# Patient Record
Sex: Female | Born: 2000 | Race: Black or African American | Hispanic: No | Marital: Single | Smoking: Never smoker
Health system: Southern US, Community
[De-identification: ages and names within clinical notes are randomized; demographics above are authoritative.]

## PROBLEM LIST (undated history)

## (undated) DIAGNOSIS — J45909 Unspecified asthma, uncomplicated: Secondary | ICD-10-CM

## (undated) DIAGNOSIS — Z889 Allergy status to unspecified drugs, medicaments and biological substances status: Secondary | ICD-10-CM

## (undated) DIAGNOSIS — A749 Chlamydial infection, unspecified: Secondary | ICD-10-CM

## (undated) HISTORY — PX: HERNIA REPAIR: SHX51

## (undated) HISTORY — DX: Allergy status to unspecified drugs, medicaments and biological substances: Z88.9

## (undated) HISTORY — DX: Unspecified asthma, uncomplicated: J45.909

---
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
## 2018-01-23 ENCOUNTER — Emergency Department (HOSPITAL_BASED_OUTPATIENT_CLINIC_OR_DEPARTMENT_OTHER): Payer: Medicaid Other

## 2018-01-23 ENCOUNTER — Encounter (HOSPITAL_BASED_OUTPATIENT_CLINIC_OR_DEPARTMENT_OTHER): Payer: Self-pay | Admitting: *Deleted

## 2018-01-23 ENCOUNTER — Emergency Department (HOSPITAL_BASED_OUTPATIENT_CLINIC_OR_DEPARTMENT_OTHER)
Admission: EM | Admit: 2018-01-23 | Discharge: 2018-01-23 | Disposition: A | Discharge status: Home / Self Care | Payer: Medicaid Other | Attending: Emergency Medicine | Admitting: Emergency Medicine

## 2018-01-23 DIAGNOSIS — N10 Acute tubulo-interstitial nephritis: Secondary | ICD-10-CM | POA: Diagnosis not present

## 2018-01-23 DIAGNOSIS — R509 Fever, unspecified: Secondary | ICD-10-CM | POA: Diagnosis present

## 2018-01-23 DIAGNOSIS — N12 Tubulo-interstitial nephritis, not specified as acute or chronic: Secondary | ICD-10-CM

## 2018-01-23 DIAGNOSIS — J45909 Unspecified asthma, uncomplicated: Secondary | ICD-10-CM | POA: Diagnosis not present

## 2018-01-23 DIAGNOSIS — E876 Hypokalemia: Secondary | ICD-10-CM | POA: Insufficient documentation

## 2018-01-23 LAB — CBC WITH DIFFERENTIAL/PLATELET
Basophils Absolute: 0 10*3/uL (ref 0.0–0.1)
Basophils Relative: 0 %
EOS ABS: 0 10*3/uL (ref 0.0–0.7)
EOS PCT: 0 %
HCT: 35.1 % — ABNORMAL LOW (ref 36.0–46.0)
Hemoglobin: 11.7 g/dL — ABNORMAL LOW (ref 12.0–15.0)
LYMPHS ABS: 0.8 10*3/uL (ref 0.7–4.0)
LYMPHS PCT: 7 %
MCH: 26.5 pg (ref 26.0–34.0)
MCHC: 33.3 g/dL (ref 30.0–36.0)
MCV: 79.6 fL (ref 78.0–100.0)
MONO ABS: 0.9 10*3/uL (ref 0.1–1.0)
Monocytes Relative: 8 %
Neutro Abs: 10.1 10*3/uL — ABNORMAL HIGH (ref 1.7–7.7)
Neutrophils Relative %: 85 %
PLATELETS: 207 10*3/uL (ref 150–400)
RBC: 4.41 MIL/uL (ref 3.87–5.11)
RDW: 14.8 % (ref 11.5–15.5)
WBC: 11.9 10*3/uL — AB (ref 4.0–10.5)

## 2018-01-23 LAB — COMPREHENSIVE METABOLIC PANEL
ALBUMIN: 4.3 g/dL (ref 3.5–5.0)
ALT: 13 U/L — ABNORMAL LOW (ref 14–54)
AST: 23 U/L (ref 15–41)
Alkaline Phosphatase: 45 U/L (ref 38–126)
Anion gap: 13 (ref 5–15)
BILIRUBIN TOTAL: 1.3 mg/dL — AB (ref 0.3–1.2)
BUN: 10 mg/dL (ref 6–20)
CHLORIDE: 100 mmol/L — AB (ref 101–111)
CO2: 21 mmol/L — ABNORMAL LOW (ref 22–32)
Calcium: 8.9 mg/dL (ref 8.9–10.3)
Creatinine, Ser: 1 mg/dL (ref 0.44–1.00)
GFR calc Af Amer: >60 mL/min (ref >60)
GLUCOSE: 106 mg/dL — AB (ref 65–99)
Potassium: 2.9 mmol/L — ABNORMAL LOW (ref 3.5–5.1)
Sodium: 134 mmol/L — ABNORMAL LOW (ref 135–145)
Total Protein: 8.5 g/dL — ABNORMAL HIGH (ref 6.5–8.1)

## 2018-01-23 LAB — URINALYSIS, MICROSCOPIC (REFLEX)

## 2018-01-23 LAB — I-STAT CG4 LACTIC ACID, ED
LACTIC ACID, VENOUS: 2.22 mmol/L — AB (ref 0.5–1.9)
LACTIC ACID, VENOUS: 0.95 mmol/L (ref 0.5–1.9)

## 2018-01-23 LAB — URINALYSIS, ROUTINE W REFLEX MICROSCOPIC
Bilirubin Urine: NEGATIVE
Glucose, UA: NEGATIVE mg/dL
Ketones, ur: 15 mg/dL — AB
Leukocytes, UA: NEGATIVE
Nitrite: POSITIVE — AB
Protein, ur: >300 mg/dL — AB
pH: 6 (ref 5.0–8.0)

## 2018-01-23 LAB — PREGNANCY, URINE: PREG TEST UR: NEGATIVE

## 2018-01-23 MED ORDER — IBUPROFEN 600 MG PO TABS: 600.0000 mg | ORAL_TABLET | Freq: Four times a day (QID) | ORAL | 0 refills | Status: DC | PRN

## 2018-01-23 MED ORDER — ACETAMINOPHEN 325 MG PO TABS: 650.0000 mg | ORAL_TABLET | Freq: Once | ORAL | Status: DC | PRN

## 2018-01-23 MED ORDER — ONDANSETRON HCL 4 MG PO TABS: 4.0000 mg | ORAL_TABLET | Freq: Four times a day (QID) | ORAL | 0 refills | Status: DC | PRN

## 2018-01-23 MED ORDER — IOPAMIDOL (ISOVUE-300) INJECTION 61%
100.0000 mL | Freq: Once | INTRAVENOUS | Status: AC | PRN
  Administered 2018-01-23: 100 mL via INTRAVENOUS

## 2018-01-23 MED ORDER — POTASSIUM CHLORIDE CRYS ER 20 MEQ PO TBCR
40.0000 meq | EXTENDED_RELEASE_TABLET | Freq: Once | ORAL | Status: AC
  Administered 2018-01-23: 40 meq via ORAL
  Filled 2018-01-23: qty 2

## 2018-01-23 MED ORDER — SODIUM CHLORIDE 0.9 % IV SOLN
1.0000 g | Freq: Once | INTRAVENOUS | Status: AC
  Administered 2018-01-23: 1 g via INTRAVENOUS
  Filled 2018-01-23: qty 10

## 2018-01-23 MED ORDER — CEPHALEXIN 500 MG PO CAPS: 500.0000 mg | ORAL_CAPSULE | Freq: Three times a day (TID) | ORAL | 0 refills | Status: AC

## 2018-01-23 MED ORDER — SODIUM CHLORIDE 0.9 % IV BOLUS
1000.0000 mL | Freq: Once | INTRAVENOUS | Status: AC
Start: 2018-01-23 — End: 2018-01-23
  Administered 2018-01-23: 1000 mL via INTRAVENOUS

## 2018-01-23 MED ORDER — ACETAMINOPHEN 500 MG PO TABS
1000.0000 mg | ORAL_TABLET | Freq: Once | ORAL | Status: AC | PRN
  Administered 2018-01-23: 1000 mg via ORAL
  Filled 2018-01-23: qty 2

## 2018-01-23 MED ORDER — SODIUM CHLORIDE 0.9 % IV BOLUS
1000.0000 mL | Freq: Once | INTRAVENOUS | Status: AC
  Administered 2018-01-23: 1000 mL via INTRAVENOUS

## 2018-01-23 NOTE — ED Provider Notes (Signed)
Patient signed out to me by Dr. Ranae PalmsYelverton.  17 year old female with fever being treated for presumptive pyelonephritis with IV antibiotics and fluids. Physical Exam  BP 105/62 (BP Location: Right Arm)   Pulse 93   Temp 98.4 F (36.9 C) (Oral)   Resp 16   Ht 5\' 11"  (1.803 m)   Wt 59.9 kg (132 lb)   LMP 01/22/2018   SpO2 100%   BMI 18.41 kg/m   Physical Exam  ED Course/Procedures   Clinical Course as of Jan 24 1756  Tue Jan 23, 2018  1752 Patient is clinically improved.  Her repeat lactate is also normalized.  She is feeling well enough to go home.  She does not have a PCP to follow-up with so I did recommend her that she return to the emergency department if any worsening.   [MB]    Clinical Course User Index [MB] Terrilee FilesButler, Aniruddh Ciavarella C, MD   Signout plan was to recheck a lactate after the IV fluids are written and make sure that her lactate is cleared.  She is to be discharged with a prescription for antibiotics and that is all been printed up.  Procedures  MDM         Terrilee FilesButler, Jefrey Raburn C, MD 01/23/18 1757

## 2018-01-23 NOTE — ED Triage Notes (Addendum)
Pt is here with lower back pain, body aches and headache and nausea for 2 days. Pt has temp of 103.46F in triage. Hx of UTI, denies burning, frequency or urgency at this time.  Last OTC med at home was tylenol last pm.

## 2018-01-23 NOTE — ED Notes (Signed)
Critical Lactic Acid 2.22 reported to Dr. Ranae PalmsYelverton and RN.

## 2018-01-23 NOTE — ED Provider Notes (Signed)
[facility] EMERGENCY DEPARTMENT Provider Note   CSN: [id number] Arrival date & time: 01/23/18  1337     History   Chief Complaint Chief Complaint  Patient presents with  . Fever    HPI Catherine Lee is a 17 y.o. female.  HPI Patient presents with 2 days of myalgias, fever, generalized headache and fatigue.  She also has had nausea.  Complains of left flank pain.  Denies dysuria, frequency or urgency.  No nasal congestion, sinus pressure, sore throat, neck stiffness, chest pain, cough, abdominal pain, vomiting or diarrhea.  Denies vaginal symptoms.  No known sick contacts. Past Medical History:  Diagnosis Date  . Asthma   . Chlamydia   . H/O seasonal allergies     Patient Active Problem List   Diagnosis Date Noted  . Normal labor 11/04/2016  . IUGR (intrauterine growth restriction) affecting care of mother, third trimester, not applicable or unspecified fetus 09/07/2016  . Supervision of normal first teen pregnancy 09/07/2016  . Fetal arrhythmia affecting pregnancy, antepartum 08/24/2016  . Supervision of normal first pregnancy, antepartum 07/26/2016    Past Surgical History:  Procedure Laterality Date  . HERNIA REPAIR     umbilical hernia     OB History    Gravida  1   Para  1   Term  1   Preterm      AB      Living  1     SAB      TAB      Ectopic      Multiple  0   Live Births  1            Home Medications    Prior to Admission medications   Medication Sig Start Date End Date Taking? Authorizing Provider  cephALEXin (KEFLEX) 500 MG capsule Take 1 capsule (500 mg total) by mouth 3 (three) times daily for 10 days. 01/23/18 02/02/18  Loren RacerYelverton, Tayona Sarnowski, MD  ibuprofen (ADVIL,MOTRIN) 600 MG tablet Take 1 tablet (600 mg total) by mouth every 6 (six) hours as needed. 01/23/18   Loren RacerYelverton, Sederick Jacobsen, MD  ondansetron (ZOFRAN) 4 MG tablet Take 1 tablet (4 mg total) by mouth every 6 (six) hours as needed for nausea or vomiting. 01/23/18   Loren RacerYelverton,  Carlo Guevarra, MD  Prenatal Vit-Fe Fumarate-FA (PRENATAL VITAMIN PO) Take by mouth.    [provider]    Family History Family History  Problem Relation Age of Onset  . Hypertension Mother   . Asthma Mother   . Kidney disease Paternal Aunt   . Heart disease Maternal Grandmother   . Asthma Maternal Grandmother   . Diabetes Maternal Grandmother   . Cancer Maternal Grandfather        liver, kidney, prostate  . Hearing loss Neg Hx     Social History Social History   Tobacco Use  . Smoking status: Never Smoker  . Smokeless tobacco: Never Used  Substance Use Topics  . Alcohol use: No  . Drug use: No     Allergies   Patient has no known allergies.   Review of Systems Review of Systems  Constitutional: Positive for chills, fatigue and fever.  HENT: Negative for congestion, rhinorrhea, sinus pressure, sinus pain and sore throat.   Eyes: Negative for visual disturbance.  Respiratory: Negative for cough, shortness of breath and wheezing.   Cardiovascular: Negative for chest pain.  Gastrointestinal: Positive for nausea. Negative for abdominal pain, blood in stool, constipation, diarrhea and vomiting.  Genitourinary: Positive  for flank pain. Negative for difficulty urinating, dysuria, frequency, hematuria, pelvic pain, vaginal bleeding and vaginal discharge.  Musculoskeletal: Positive for back pain and myalgias. Negative for neck pain and neck stiffness.  Skin: Negative for rash and wound.  Neurological: Positive for headaches. Negative for dizziness, syncope, weakness, light-headedness and numbness.  All other systems reviewed and are negative.    Physical Exam Updated Vital Signs BP 105/62 (BP Location: Right Arm)   Pulse 93   Temp 98.4 F (36.9 C) (Oral)   Resp 16   Ht 5\' 11"  (1.803 m)   Wt 59.9 kg (132 lb)   LMP 01/22/2018   SpO2 100%   BMI 18.41 kg/m   Physical Exam  Constitutional: She is oriented to person, place, and time. She appears well-developed and  well-nourished. No distress.  HENT:  Head: Normocephalic and atraumatic.  Mouth/Throat: Oropharynx is clear and moist. No oropharyngeal exudate.  Eyes: Pupils are equal, round, and reactive to light. EOM are normal.  Neck: Normal range of motion. Neck supple.  No meningismus  Cardiovascular: Regular rhythm.  Tachycardia  Pulmonary/Chest: Effort normal and breath sounds normal. No stridor. No respiratory distress. She has no wheezes. She has no rales. She exhibits no tenderness.  Abdominal: Soft. Bowel sounds are normal. There is tenderness. There is no rebound and no guarding.  Tenderness to deep palpation in the left upper quadrant.  No rebound or guarding.  Musculoskeletal: Normal range of motion. She exhibits no edema or tenderness.  Left CVA tenderness to percussion.  Lymphadenopathy:    She has no cervical adenopathy.  Neurological: She is alert and oriented to person, place, and time.  Moves all extremities without deficit.  Sensation intact.  Skin: Skin is warm and dry. Capillary refill takes less than 2 seconds. No rash noted. She is not diaphoretic. No erythema.  Psychiatric: She has a normal mood and affect. Her behavior is normal.  Nursing note and vitals reviewed.    ED Treatments / Results  Labs (all labs ordered are listed, but only abnormal results are displayed) Labs Reviewed  URINALYSIS, ROUTINE W REFLEX MICROSCOPIC - Abnormal; Notable for the following components:      Result Value   Color, Urine AMBER (*)    APPearance CLOUDY (*)    Specific Gravity, Urine >1.030 (*)    Hgb urine dipstick TRACE (*)    Ketones, ur 15 (*)    Protein, ur >300 (*)    Nitrite POSITIVE (*)    All other components within normal limits  COMPREHENSIVE METABOLIC PANEL - Abnormal; Notable for the following components:   Sodium 134 (*)    Potassium 2.9 (*)    Chloride 100 (*)    CO2 21 (*)    Glucose, Bld 106 (*)    Total Protein 8.5 (*)    ALT 13 (*)    Total Bilirubin 1.3 (*)      All other components within normal limits  CBC WITH DIFFERENTIAL/PLATELET - Abnormal; Notable for the following components:   WBC 11.9 (*)    Hemoglobin 11.7 (*)    HCT 35.1 (*)    Neutro Abs 10.1 (*)    All other components within normal limits  URINALYSIS, MICROSCOPIC (REFLEX) - Abnormal; Notable for the following components:   Bacteria, UA MANY (*)    Squamous Epithelial / LPF 0-5 (*)    All other components within normal limits  I-STAT CG4 LACTIC ACID, ED - Abnormal; Notable for the following components:   Lactic  Acid, Venous 2.22 (*)    All other components within normal limits  CULTURE, BLOOD (ROUTINE X 2)  CULTURE, BLOOD (ROUTINE X 2)  URINE CULTURE  PREGNANCY, URINE  I-STAT CG4 LACTIC ACID, ED    EKG None  Radiology Ct Abdomen Pelvis W Contrast  Result Date: 01/23/2018 CLINICAL DATA:  Left-sided flank pain over the last 2 days. EXAM: CT ABDOMEN AND PELVIS WITH CONTRAST TECHNIQUE: Multidetector CT imaging of the abdomen and pelvis was performed using the standard protocol following bolus administration of intravenous contrast. CONTRAST:  ISOVUE-300 IOPAMIDOL (ISOVUE-300) INJECTION 61% COMPARISON:  None. FINDINGS: Lower chest: Normal Hepatobiliary: Mild focal fat adjacent to false form ligament not significant. Otherwise normal appearance the liver. No calcified gallstones Pancreas: Normal Spleen: Normal Adrenals/Urinary Tract: Adrenal glands are normal. Kidneys are normal. No cyst, mass, stone or hydronephrosis. Bladder is normal. Stomach/Bowel: No abnormal bowel finding. Vascular/Lymphatic: Normal Reproductive: Uterus appears normal. Both ovaries appear normal for age. Other: No free fluid or air. Musculoskeletal: Normal IMPRESSION: No cause of left flank pain identified.  Negative CT. Electronically Signed   By: Paulina Fusi M.D.   On: 01/23/2018 15:40    Procedures Procedures (including critical care time)  Medications Ordered in ED Medications  acetaminophen  (TYLENOL) tablet 1,000 mg (1,000 mg Oral Given 01/23/18 1359)  sodium chloride 0.9 % bolus 1,000 mL (0 mLs Intravenous Stopped 01/23/18 1557)  iopamidol (ISOVUE-300) 61 % injection 100 mL (100 mLs Intravenous Contrast Given 01/23/18 1517)  cefTRIAXone (ROCEPHIN) 1 g in sodium chloride 0.9 % 100 mL IVPB (0 g Intravenous Stopped 01/23/18 1651)  sodium chloride 0.9 % bolus 1,000 mL (0 mLs Intravenous Stopped 01/23/18 1636)  potassium chloride SA (K-DUR,KLOR-CON) CR tablet 40 mEq (40 mEq Oral Given 01/23/18 1556)     Initial Impression / Assessment and Plan / ED Course  I have reviewed the triage vital signs and the nursing notes.  Pertinent labs & imaging results that were available during my care of the patient were reviewed by me and considered in my medical decision making (see chart for details).  Clinical Course as of Jan 24 709  Tue Jan 23, 2018  1752 Patient is clinically improved.  Her repeat lactate is also normalized.  She is feeling well enough to go home.  She does not have a PCP to follow-up with so I did recommend her that she return to the emergency department if any worsening.   [MB]    Clinical Course User Index [MB] Terrilee Files, MD    Patient is well-appearing.  Febrile and tachycardic.  Suspect likely pyelonephritis.  UA with evidence of UTI.  No acute abnormalities on CT scan.  Given left flank tenderness, will treat for pyelonephritis.  Given dose of IV Rocephin and IV fluids.  Mildly elevated lactic acid patient's tachycardia has also resolved after IV fluids and Tylenol.  Signed out to oncoming emergency physician pending repeat evaluation after IV fluids have completed and repeat lactic acid.  Anticipate discharge home. Final Clinical Impressions(s) / ED Diagnoses   Final diagnoses:  Pyelonephritis  Hypokalemia    ED Discharge Orders        Ordered    cephALEXin (KEFLEX) 500 MG capsule  3 times daily     01/23/18 1602    ondansetron (ZOFRAN) 4 MG tablet  Every 6  hours PRN     01/23/18 1602    ibuprofen (ADVIL,MOTRIN) 600 MG tablet  Every 6 hours PRN     01/23/18  8119       Loren Racer, MD 01/24/18 [phone]

## 2018-01-23 NOTE — ED Notes (Signed)
Spoke with lab about urine which is pending.  They are running it.

## 2018-01-23 NOTE — ED Notes (Signed)
Pt verbalizes understanding of d/c instructions and denies any further needs at this time. 

## 2018-01-23 NOTE — ED Notes (Signed)
ED Provider at bedside. 

## 2018-01-23 NOTE — ED Notes (Signed)
Urine sample and culture sent to lab 

## 2018-01-25 LAB — URINE CULTURE: Culture: >=100000 — AB

## 2018-01-26 ENCOUNTER — Telehealth: Payer: Self-pay | Admitting: *Deleted

## 2018-01-26 NOTE — Telephone Encounter (Signed)
Post ED Visit - Positive Culture Follow-up  Culture report reviewed by antimicrobial stewardship pharmacist:  [x]  Enzo BiNathan Batchelder, Pharm.D. []  Celedonio MiyamotoJeremy Frens, Pharm.D., BCPS AQ-ID []  Garvin FilaMike Maccia, Pharm.D., BCPS []  Georgina PillionElizabeth Martin, Pharm.D., BCPS []  LinwoodMinh Pham, [place].D., BCPS, AAHIVP []  Estella HuskMichelle Turner, Pharm.D., BCPS, AAHIVP []  Lysle Pearlachel Rumbarger, PharmD, BCPS []  Blake DivineShannon Parkey, PharmD []  Pollyann SamplesAndy Johnston, PharmD, BCPS  Positive urine culture Treated with Cephalexin, organism sensitive to the same and no further patient follow-up is required at this time.  Virl AxeRobertson, [facility] 01/26/2018, 10:26 AM

## 2018-01-28 LAB — CULTURE, BLOOD (ROUTINE X 2)
Culture: NO GROWTH
Culture: NO GROWTH
Special Requests: ADEQUATE
Special Requests: ADEQUATE

## 2018-04-02 IMAGING — CT CT ABD-PELV W/ CM
2 of 4 series · 17 of 46 positions shown, 19 images · IV contrast (APPLIED)
Comparison: None.

CLINICAL DATA: Left-sided flank pain over the last 2 days.

EXAM:
CT ABDOMEN AND PELVIS WITH CONTRAST
TECHNIQUE: Multidetector CT imaging of the abdomen and pelvis was performed
using the standard protocol following bolus administration of
intravenous contrast.
CONTRAST:  100mL [id number] IOPAMIDOL ([id number]) INJECTION 61%

[Series 2: axial st · axial · 0.83mm/px · z∈[-427,+3]mm · 14 of 94 slices shown, 16 images]
[im 4/94  soft-tissue]
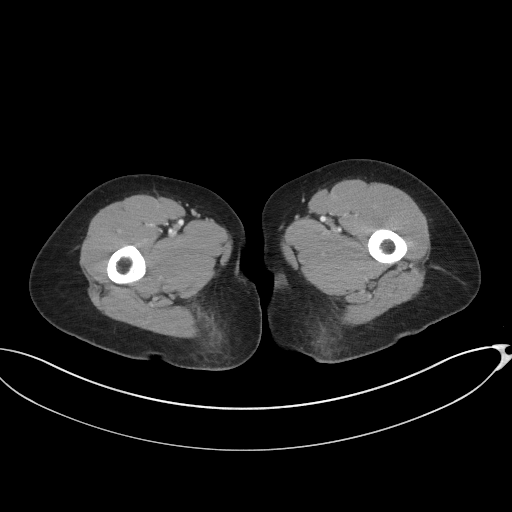
[im 4/94  bone]
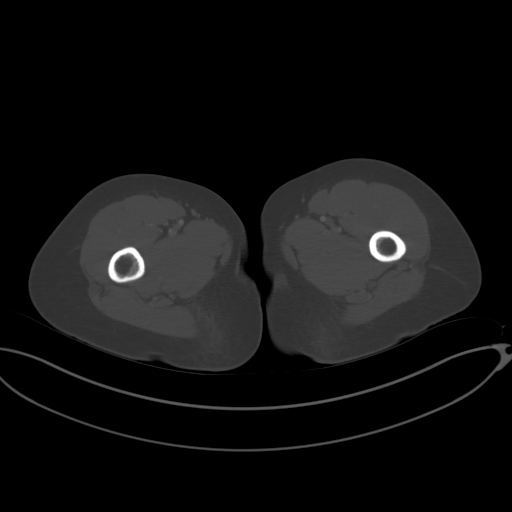
[im 12/94  soft-tissue]
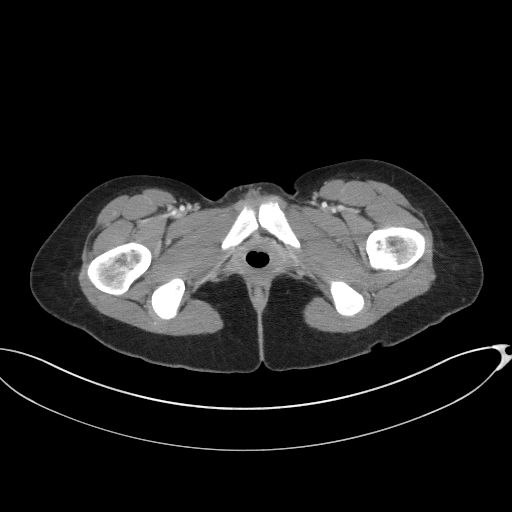
[im 20/94  soft-tissue]
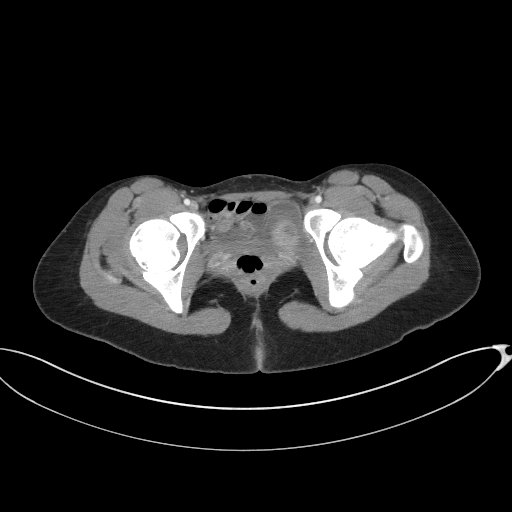
[im 24/94  soft-tissue]
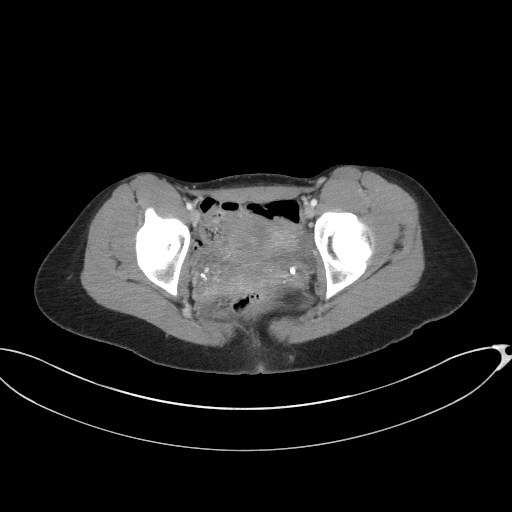
[im 32/94  soft-tissue]
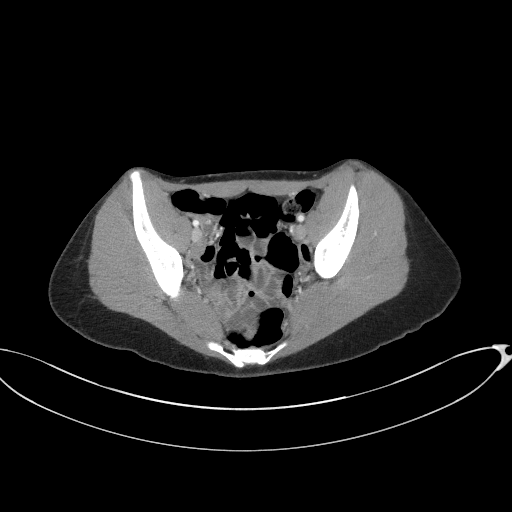
[im 39/94  soft-tissue]
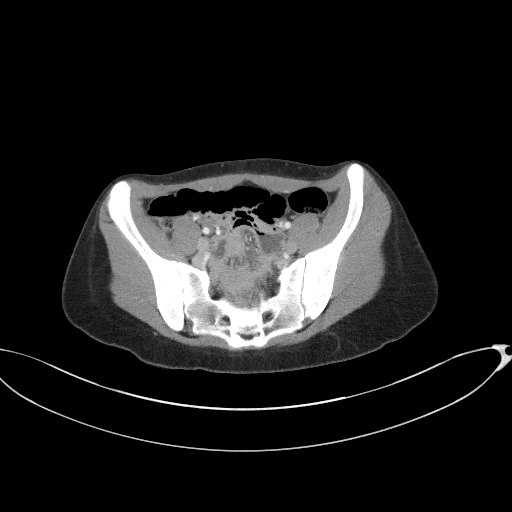
[im 43/94  soft-tissue]
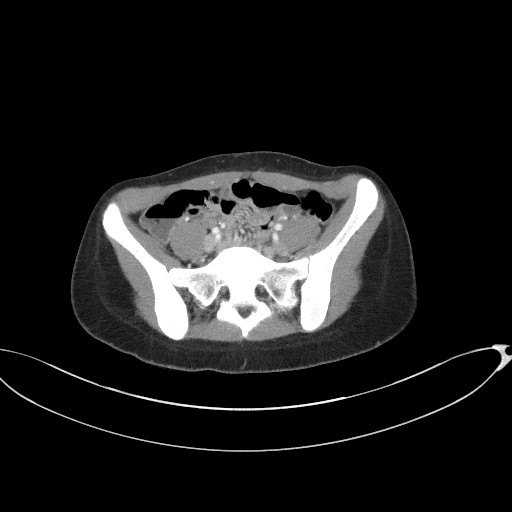
[im 51/94  soft-tissue]
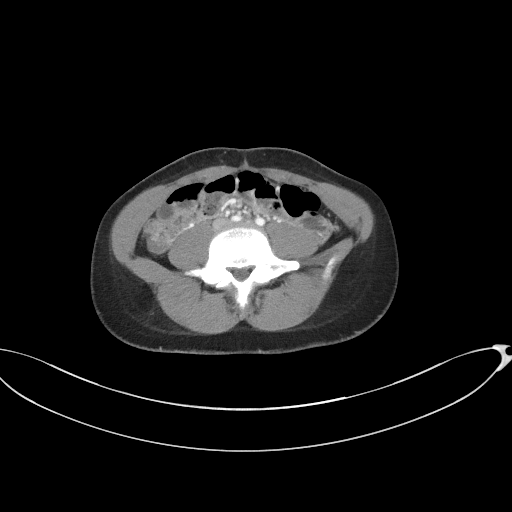
[im 55/94  soft-tissue]
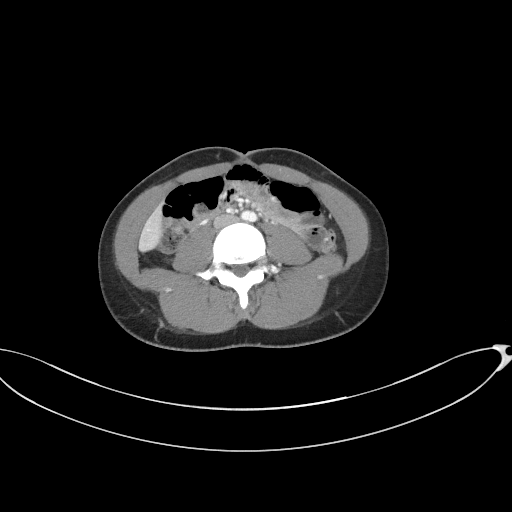
[im 55/94  bone]
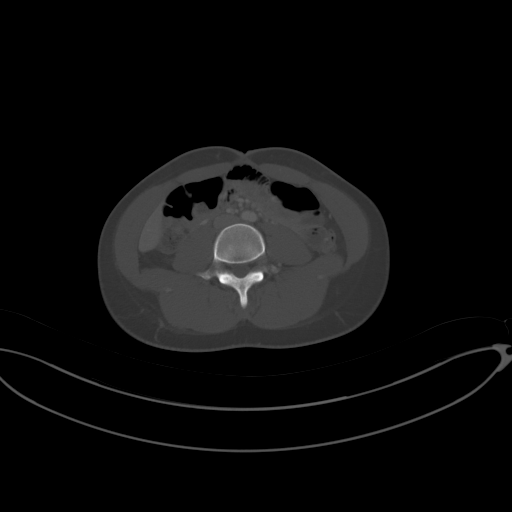
[im 63/94  soft-tissue]
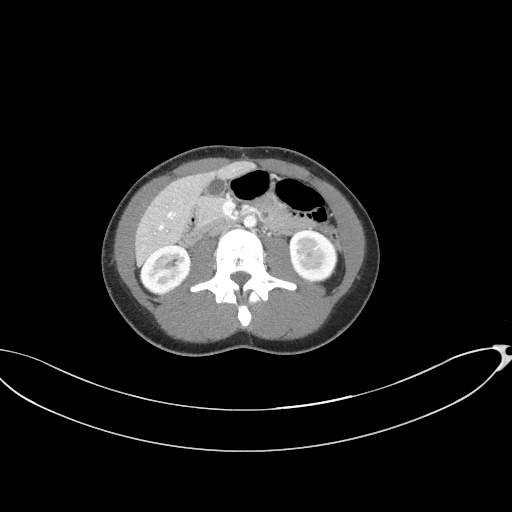
[im 70/94  soft-tissue]
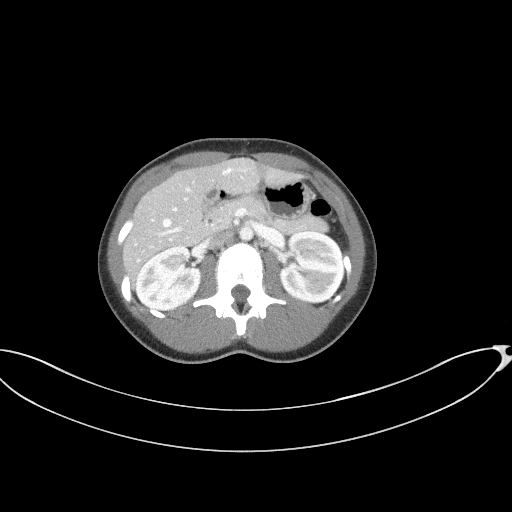
[im 74/94  soft-tissue]
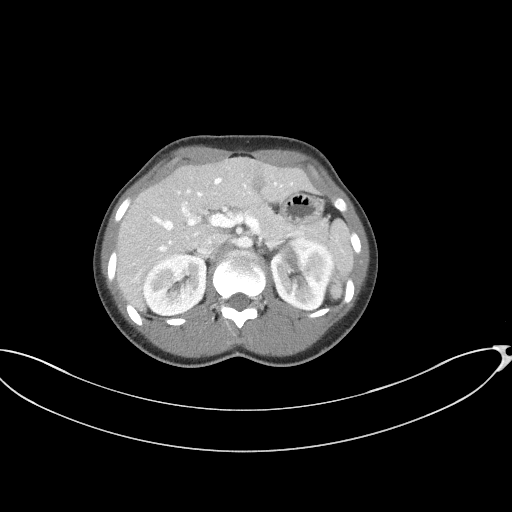
[im 82/94  soft-tissue]
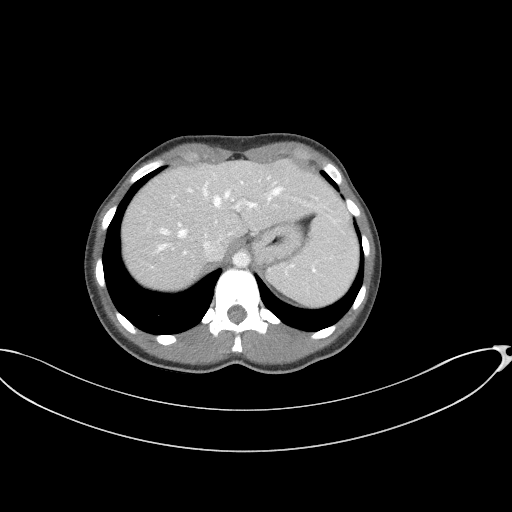
[im 90/94  soft-tissue]
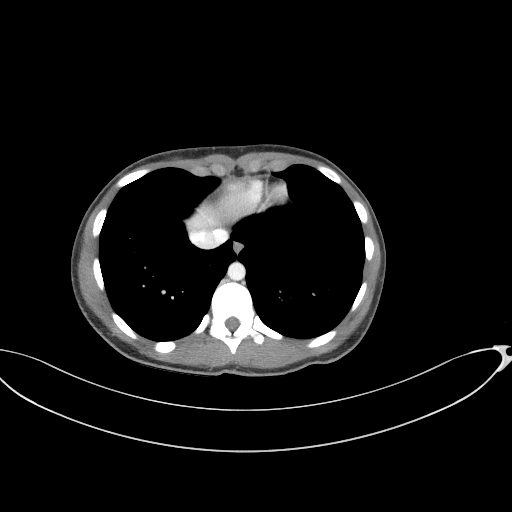

[Series 5: coronal st · coronal · 0.79mm/px · 3 of 70 slices shown]
[im 24/70  soft-tissue]
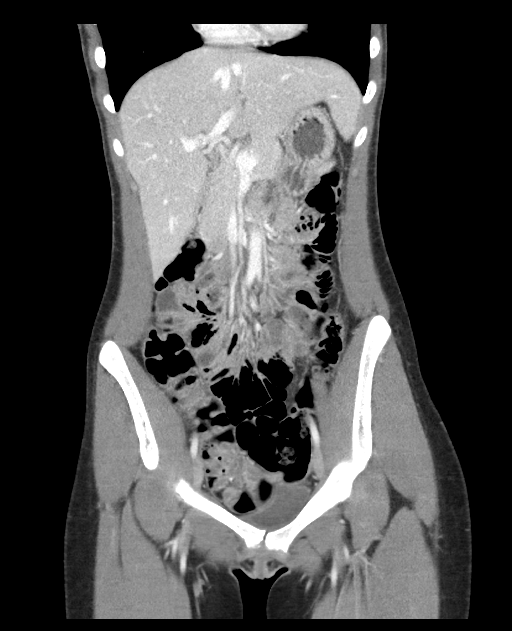
[im 31/70  soft-tissue]
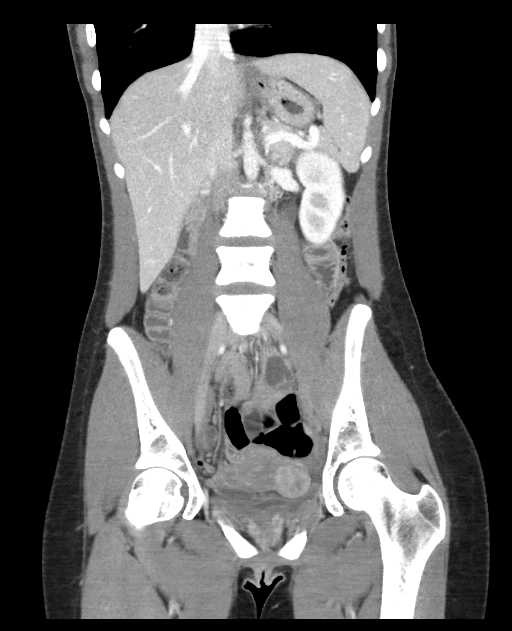
[im 39/70  soft-tissue]
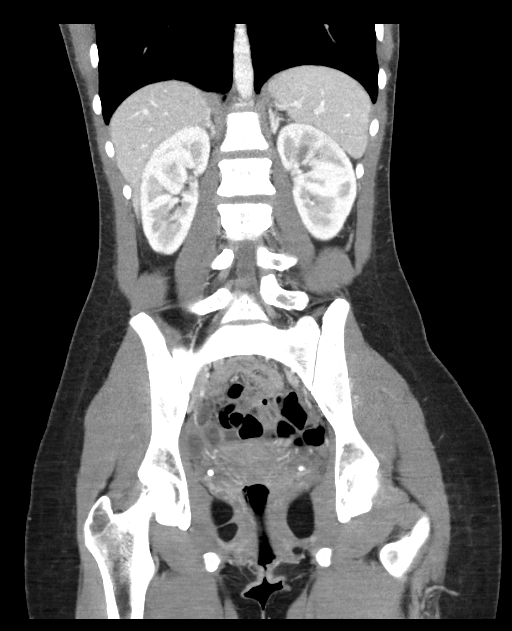

[17 of 46 positions shown; findings below may reference images not displayed]

FINDINGS: Lower chest: Normal

Hepatobiliary: Mild focal fat adjacent to false form ligament not
significant. Otherwise normal appearance the liver. No calcified
gallstones

Pancreas: Normal

Spleen: Normal

Adrenals/Urinary Tract: Adrenal glands are normal. Kidneys are
normal. No cyst, mass, stone or hydronephrosis. Bladder is normal.

Stomach/Bowel: No abnormal bowel finding.

Vascular/Lymphatic: Normal

Reproductive: Uterus appears normal. Both ovaries appear normal for
age.

Other: No free fluid or air.

Musculoskeletal: Normal
IMPRESSION: No cause of left flank pain identified.  Negative CT.

## 2022-02-20 ENCOUNTER — Emergency Department (HOSPITAL_BASED_OUTPATIENT_CLINIC_OR_DEPARTMENT_OTHER)
Admission: EM | Admit: 2022-02-20 | Discharge: 2022-02-20 | Disposition: A | Discharge status: Home / Self Care | Payer: Medicaid Other | Attending: Emergency Medicine | Admitting: Emergency Medicine

## 2022-02-20 ENCOUNTER — Other Ambulatory Visit: Payer: Self-pay

## 2022-02-20 ENCOUNTER — Encounter (HOSPITAL_BASED_OUTPATIENT_CLINIC_OR_DEPARTMENT_OTHER): Payer: Self-pay | Admitting: Emergency Medicine

## 2022-02-20 DIAGNOSIS — N3000 Acute cystitis without hematuria: Secondary | ICD-10-CM | POA: Diagnosis not present

## 2022-02-20 DIAGNOSIS — R350 Frequency of micturition: Secondary | ICD-10-CM | POA: Diagnosis present

## 2022-02-20 LAB — URINALYSIS, ROUTINE W REFLEX MICROSCOPIC
Bilirubin Urine: NEGATIVE
Glucose, UA: NEGATIVE mg/dL
Ketones, ur: NEGATIVE mg/dL
Nitrite: NEGATIVE
Protein, ur: 100 mg/dL — AB
Specific Gravity, Urine: 1.025 (ref 1.005–1.030)
pH: 7 (ref 5.0–8.0)

## 2022-02-20 LAB — URINALYSIS, MICROSCOPIC (REFLEX): RBC / HPF: >50 RBC/hpf (ref 0–5)

## 2022-02-20 LAB — PREGNANCY, URINE: Preg Test, Ur: NEGATIVE

## 2022-02-20 MED ORDER — CEPHALEXIN 500 MG PO CAPS: 500.0000 mg | ORAL_CAPSULE | Freq: Three times a day (TID) | ORAL | 0 refills | Status: AC

## 2022-02-20 MED ORDER — CEPHALEXIN 500 MG PO CAPS: 500.0000 mg | ORAL_CAPSULE | Freq: Three times a day (TID) | ORAL | 0 refills | Status: DC

## 2022-02-20 NOTE — ED Notes (Signed)
Patient reports increased urinary frequency, but has minimal amount when she does urinate. States she has tingling sensation when she finishes urinating. Reports some pain with urination. Denies any vaginal discharge. ?

## 2022-02-20 NOTE — ED Provider Notes (Signed)
?[facility] EMERGENCY DEPARTMENT ?Provider Note ? ? ?CSN: [id number] ?Arrival date & time: 02/20/22  1210 ? ?  ? ?History ? ?Chief Complaint  ?Patient presents with  ? Urinary Frequency  ? ? ?Catherine Lee is a 21 y.o. female. ? ?Presents with 1 weeks duration of urinary frequency pain with urination sensation.  She took AZO over-the-counter without improvement and presents to ER today.  Otherwise denies any fevers denies back pain denies abdominal pain.  No vomiting cough or diarrhea. ? ? ?  ? ?Home Medications ?Prior to Admission medications   ?Medication Sig Start Date End Date Taking? Authorizing Provider  ?cephALEXin (KEFLEX) 500 MG capsule Take 1 capsule (500 mg total) by mouth 3 (three) times daily for 7 days. 02/20/22 02/27/22 Yes Cheryll Cockayne, MD  ?ibuprofen (ADVIL,MOTRIN) 600 MG tablet Take 1 tablet (600 mg total) by mouth every 6 (six) hours as needed. 01/23/18   Loren Racer, MD  ?ondansetron (ZOFRAN) 4 MG tablet Take 1 tablet (4 mg total) by mouth every 6 (six) hours as needed for nausea or vomiting. 01/23/18   Loren Racer, MD  ?Prenatal Vit-Fe Fumarate-FA (PRENATAL VITAMIN PO) Take by mouth.    [provider]  ?   ? ?Allergies    ?Patient has no known allergies.   ? ?Review of Systems   ?Review of Systems  ?Constitutional:  Negative for fever.  ?HENT:  Negative for ear pain.   ?Eyes:  Negative for pain.  ?Respiratory:  Negative for cough.   ?Cardiovascular:  Negative for chest pain.  ?Gastrointestinal:  Negative for abdominal pain.  ?Genitourinary:  Negative for flank pain.  ?Musculoskeletal:  Negative for back pain.  ?Skin:  Negative for rash.  ?Neurological:  Negative for headaches.  ? ?Physical Exam ?Updated Vital Signs ?BP (!) 132/93   Pulse 74   Temp 98.5 ?F (36.9 ?C) (Oral)   Resp 18   Ht 5\' 11"  (1.803 m)   Wt 68 kg   LMP 02/05/2022   SpO2 100%   BMI 20.92 kg/m?  ?Physical Exam ?Constitutional:   ?   General: She is not in acute distress. ?   Appearance: Normal  appearance.  ?HENT:  ?   Head: Normocephalic.  ?   Nose: Nose normal.  ?Eyes:  ?   Extraocular Movements: Extraocular movements intact.  ?Cardiovascular:  ?   Rate and Rhythm: Normal rate.  ?Pulmonary:  ?   Effort: Pulmonary effort is normal.  ?Abdominal:  ?   Tenderness: There is no abdominal tenderness. There is no right CVA tenderness, left CVA tenderness, guarding or rebound.  ?Musculoskeletal:     ?   General: Normal range of motion.  ?   Cervical back: Normal range of motion.  ?Neurological:  ?   General: No focal deficit present.  ?   Mental Status: She is alert. Mental status is at baseline.  ? ? ?ED Results / Procedures / Treatments   ?Labs ?(all labs ordered are listed, but only abnormal results are displayed) ?Labs Reviewed  ?URINALYSIS, ROUTINE W REFLEX MICROSCOPIC - Abnormal; Notable for the following components:  ?    Result Value  ? Color, Urine BROWN (*)   ? APPearance CLOUDY (*)   ? Hgb urine dipstick LARGE (*)   ? Protein, ur 100 (*)   ? Leukocytes,Ua SMALL (*)   ? All other components within normal limits  ?URINALYSIS, MICROSCOPIC (REFLEX) - Abnormal; Notable for the following components:  ? Bacteria, UA MANY (*)   ?  All other components within normal limits  ?PREGNANCY, URINE  ? ? ?EKG ?None ? ?Radiology ?No results found. ? ?Procedures ?Procedures  ? ? ?Medications Ordered in ED ?Medications - No data to display ? ?ED Course/ Medical Decision Making/ A&P ?  ?                        ?Medical Decision Making ?Amount and/or Complexity of Data Reviewed ?Labs: ordered. ? ? ?Urinalysis sent positive for many bacteria.  Pregnancy test negative. ? ?Patient otherwise presents with normal vitals.  We will give a prescription of antibiotics to go home with.  Advise follow-up with her doctor within the next 3 to 4 days.  Advising immediate return for fevers worsening pain or any additional concerns. ? ? ? ? ? ? ? ?Final Clinical Impression(s) / ED Diagnoses ?Final diagnoses:  ?Acute cystitis without  hematuria  ? ? ?Rx / DC Orders ?ED Discharge Orders   ? ?      Ordered  ?  cephALEXin (KEFLEX) 500 MG capsule  3 times daily       ? 02/20/22 1437  ? ?  ?  ? ?  ? ? ?  ?Cheryll Cockayne, MD ?02/20/22 1437 ? ?

## 2022-02-20 NOTE — ED Triage Notes (Signed)
Pt c/o UTI symptoms of vaginal pain and frequent urination x 1 week. ?

## 2022-02-20 NOTE — Discharge Instructions (Signed)
Call your primary care doctor or specialist as discussed in the next 2-3 days.   Return immediately back to the ER if:  Your symptoms worsen within the next 12-24 hours. You develop new symptoms such as new fevers, persistent vomiting, new pain, shortness of breath, or new weakness or numbness, or if you have any other concerns.  

## 2022-10-24 NOTE — L&D Delivery Note (Signed)
Labor Progress: Catherine Lee is a 22 y.o. female G2P1001 with IUP at [redacted]w[redacted]d admitted for spontaneous labor .  She progressed with AROM alone as augmentation to complete and pushed  ~ 20 minutes to deliver.  Cord clamping delayed by 1+ minutes then clamped by CNM and cut by FOB.  Placenta intact and spontaneous, bleeding minimal.  Superficial, hemostatic left labial laceration not repaired.   Mom and baby stable prior to transfer to postpartum. She plans on breastfeeding. She requests Nexplanon for contraception.    Delivery Note At 10:06 PM a {baby status:3041425} female was delivered via Vaginal, Spontaneous (Presentation: Right Occiput Anterior).  APGAR: 9, 9; weight  .   Placenta status: Spontaneous, Intact.  Cord: 3 vessels with the following complications: None.  Cord pH: ***  Anesthesia: None Episiotomy: None Lacerations: None Suture Repair: {suture BJYN:[id number]} Est. Blood Loss (mL): 105  Mom to {mom status:3041454}.  Baby to {baby status:3041455}.  Sharen Counter 06/17/2023, 10:43 PM

## 2022-11-07 ENCOUNTER — Encounter: Payer: Self-pay | Admitting: General Practice

## 2022-12-29 ENCOUNTER — Encounter: Payer: Self-pay | Admitting: General Practice

## 2022-12-29 ENCOUNTER — Other Ambulatory Visit (HOSPITAL_COMMUNITY)
Admission: RE | Admit: 2022-12-29 | Discharge: 2022-12-29 | Disposition: A | Discharge status: Home / Self Care | Payer: Medicaid Other | Source: Ambulatory Visit | Attending: Family Medicine | Admitting: Family Medicine

## 2022-12-29 ENCOUNTER — Ambulatory Visit (INDEPENDENT_AMBULATORY_CARE_PROVIDER_SITE_OTHER): Payer: Medicaid Other

## 2022-12-29 ENCOUNTER — Encounter: Payer: Self-pay | Admitting: Family Medicine

## 2022-12-29 ENCOUNTER — Ambulatory Visit (INDEPENDENT_AMBULATORY_CARE_PROVIDER_SITE_OTHER): Payer: Medicaid Other | Admitting: Family Medicine

## 2022-12-29 VITALS — BP 123/82 | HR 104 | Wt 131.0 lb

## 2022-12-29 DIAGNOSIS — Z3482 Encounter for supervision of other normal pregnancy, second trimester: Secondary | ICD-10-CM | POA: Diagnosis not present

## 2022-12-29 DIAGNOSIS — Z3A13 13 weeks gestation of pregnancy: Secondary | ICD-10-CM

## 2022-12-29 DIAGNOSIS — Z1339 Encounter for screening examination for other mental health and behavioral disorders: Secondary | ICD-10-CM

## 2022-12-29 DIAGNOSIS — Z3481 Encounter for supervision of other normal pregnancy, first trimester: Secondary | ICD-10-CM

## 2022-12-29 DIAGNOSIS — Z3480 Encounter for supervision of other normal pregnancy, unspecified trimester: Secondary | ICD-10-CM | POA: Insufficient documentation

## 2022-12-29 DIAGNOSIS — Z23 Encounter for immunization: Secondary | ICD-10-CM | POA: Diagnosis not present

## 2022-12-29 DIAGNOSIS — D563 Thalassemia minor: Secondary | ICD-10-CM

## 2022-12-29 DIAGNOSIS — O3680X0 Pregnancy with inconclusive fetal viability, not applicable or unspecified: Secondary | ICD-10-CM

## 2022-12-29 NOTE — Progress Notes (Signed)
Subjective:  Catherine Lee is a G2P1001 55w5dby LMP c/w [place], being seen today for her first obstetrical visit.  Her obstetrical history is significant for intrauterine growth restriction (IUGR) with NSVD. Patient does intend to breast feed. Pregnancy history fully reviewed.  Patient reports no complaints.  BP 123/82   Pulse (!) 104   Wt 131 lb (59.4 kg)   LMP 09/24/2022   BMI 18.27 kg/m   HISTORY: OB History  Gravida Para Term Preterm AB Living  '2 1 1     1  '$ SAB IAB Ectopic Multiple Live Births        0 1    # Outcome Date GA Lbr Len/2nd Weight Sex Delivery Anes PTL Lv  2 Current           1 Term 11/04/16 380w6d5:16 / 00:04 6 lb 1.9 oz (2.775 kg) F Vag-Spont None  LIV    Past Medical History:  Diagnosis Date   Asthma    Chlamydia    H/O seasonal allergies     Past Surgical History:  Procedure Laterality Date   HERNIA REPAIR     umbilical hernia    Family History  Problem Relation Age of Onset   Hypertension Mother    Asthma Mother    Kidney disease Paternal Aunt    Heart disease Maternal Grandmother    Asthma Maternal Grandmother    Diabetes Maternal Grandmother    Cancer Maternal Grandfather        liver, kidney, prostate   Hearing loss Neg Hx      Exam  BP 123/82   Pulse (!) 104   Wt 131 lb (59.4 kg)   LMP 09/24/2022   BMI 18.27 kg/m   Chaperone present during exam  CONSTITUTIONAL: Well-developed, well-nourished female in no acute distress.  HENT:  Normocephalic, atraumatic, External right and left ear normal. Oropharynx is clear and moist EYES: Conjunctivae and EOM are normal. Pupils are equal, round, and reactive to light. No scleral icterus.  NECK: Normal range of motion, supple, no masses.  Normal thyroid.  CARDIOVASCULAR: Normal heart rate noted, regular rhythm RESPIRATORY: Clear to auscultation bilaterally. Effort and breath sounds normal, no problems with respiration noted. BREASTS: Symmetric in size. No masses, skin changes, nipple  drainage, or lymphadenopathy. ABDOMEN: Soft, normal bowel sounds, no distention noted.  No tenderness, rebound or guarding.  PELVIC: Normal appearing external genitalia; normal appearing vaginal mucosa and cervix. No abnormal discharge noted. Normal uterine size, no other palpable masses, no uterine or adnexal tenderness. MUSCULOSKELETAL: Normal range of motion. No tenderness.  No cyanosis, clubbing, or edema.  2+ distal pulses. SKIN: Skin is warm and dry. No rash noted. Not diaphoretic. No erythema. No pallor. NEUROLOGIC: Alert and oriented to person, place, and time. Normal reflexes, muscle tone coordination. No cranial nerve deficit noted. PSYCHIATRIC: Normal mood and affect. Normal behavior. Normal judgment and thought content.    Assessment:    Pregnancy: G2P1001 Patient Active Problem List   Diagnosis Date Noted   Supervision of other normal pregnancy, antepartum 12/29/2022   Normal labor 11/04/2016   IUGR (intrauterine growth restriction) affecting care of mother, third trimester, not applicable or unspecified fetus 09/07/2016   Fetal arrhythmia affecting pregnancy, antepartum 08/24/2016      Plan:   1. Encounter to determine fetal viability of pregnancy, single or unspecified fetus - [employer]; Future  2. Supervision of other normal pregnancy, antepartum FHT and FH normal - CBC/D/Plt+RPR+Rh+ABO+RubIgG... - Urine Culture - [place]  MFM OB COMP + 14 WK; Future - Cytology - PAP( [place]) - Panorama Prenatal Test Full Panel - [employer] - CH[facility] AMB BABYSCRIPTS OPT IN - Flu Vaccine QUAD 19moIM (Fluarix, Fluzone & Alfiuria Quad PF)  3. [redacted] weeks gestation of pregnancy - CBC/D/Plt+RPR+Rh+ABO+RubIgG... - Urine Culture - [place]FM OB COMP + 14 WK; Future - Cytology - PAP( [place]) - Panorama Prenatal Test Full Panel - [employer] - CHL AMB BABYSCRIPTS OPT IN - Flu Vaccine QUAD 652moM (Fluarix, Fluzone & Alfiuria Quad PF)    Initial labs obtained Continue  prenatal vitamins Reviewed n/v relief measures and warning s/s to report Reviewed recommended weight gain based on pre-gravid BMI Encouraged well-balanced diet Genetic & carrier screening discussed: requests Panorama,  Ultrasound discussed; fetal survey: requested CCNC completed> form faxed if has or is planning to apply for medicaid The nature of [place] MDs and other Advanced Practice Providers was explained to patient; also emphasized that fellows, residents, and students are part of our team.  No indication for ASA '81mg'$  or early GDM testing.  Problem list reviewed and updated. 75% of 30 min visit spent on counseling and coordination of care.     JaTruett Mainland/04/2023

## 2022-12-30 LAB — HCV INTERPRETATION

## 2022-12-30 LAB — CBC/D/PLT+RPR+RH+ABO+RUBIGG...
Antibody Screen: NEGATIVE
Basophils Absolute: 0 10*3/uL (ref 0.0–0.2)
Basos: 0 %
EOS (ABSOLUTE): 0 10*3/uL (ref 0.0–0.4)
Eos: 0 %
HCV Ab: NONREACTIVE
HIV Screen 4th Generation wRfx: NONREACTIVE
Hematocrit: 32.7 % — ABNORMAL LOW (ref 34.0–46.6)
Hemoglobin: 10.8 g/dL — ABNORMAL LOW (ref 11.1–15.9)
Hepatitis B Surface Ag: NEGATIVE
Immature Grans (Abs): 0 10*3/uL (ref 0.0–0.1)
Immature Granulocytes: 0 %
Lymphocytes Absolute: 1.6 10*3/uL (ref 0.7–3.1)
Lymphs: 39 %
MCH: 28.6 pg (ref 26.6–33.0)
MCHC: 33 g/dL (ref 31.5–35.7)
MCV: 87 fL (ref 79–97)
Monocytes Absolute: 0.3 10*3/uL (ref 0.1–0.9)
Monocytes: 8 %
Neutrophils Absolute: 2.1 10*3/uL (ref 1.4–7.0)
Neutrophils: 53 %
Platelets: 220 10*3/uL (ref 150–450)
RBC: 3.77 x10E6/uL (ref 3.77–5.28)
RDW: 12.8 % (ref 11.7–15.4)
RPR Ser Ql: NONREACTIVE
Rh Factor: POSITIVE
Rubella Antibodies, IGG: 4.3 index (ref >0.99)
WBC: 4 10*3/uL (ref 3.4–10.8)

## 2022-12-31 LAB — URINE CULTURE: Organism ID, Bacteria: NO GROWTH

## 2023-01-04 LAB — PANORAMA PRENATAL TEST FULL PANEL:PANORAMA TEST PLUS 5 ADDITIONAL MICRODELETIONS: FETAL FRACTION: 10

## 2023-01-04 LAB — CYTOLOGY - PAP
Chlamydia: POSITIVE — AB
Comment: NEGATIVE
Comment: NEGATIVE
Comment: NORMAL
Diagnosis: UNDETERMINED — AB
High risk HPV: NEGATIVE
Neisseria Gonorrhea: NEGATIVE

## 2023-01-05 ENCOUNTER — Encounter: Payer: Self-pay | Admitting: Family Medicine

## 2023-01-05 DIAGNOSIS — R87610 Atypical squamous cells of undetermined significance on cytologic smear of cervix (ASC-US): Secondary | ICD-10-CM | POA: Insufficient documentation

## 2023-01-06 ENCOUNTER — Other Ambulatory Visit: Payer: Self-pay

## 2023-01-06 MED ORDER — AZITHROMYCIN 500 MG PO TABS: 1000.0000 mg | ORAL_TABLET | Freq: Every day | ORAL | 1 refills | Status: DC

## 2023-01-06 NOTE — Telephone Encounter (Signed)
Left message for patient to return call to office.   Patient called and desired to have her results. +chlamydia on pap. [name] RN

## 2023-01-09 LAB — HORIZON CUSTOM: REPORT SUMMARY: POSITIVE — AB

## 2023-01-10 ENCOUNTER — Other Ambulatory Visit (HOSPITAL_BASED_OUTPATIENT_CLINIC_OR_DEPARTMENT_OTHER): Payer: Self-pay

## 2023-01-10 ENCOUNTER — Other Ambulatory Visit: Payer: Self-pay

## 2023-01-10 MED ORDER — AZITHROMYCIN 500 MG PO TABS
1000.0000 mg | ORAL_TABLET | Freq: Every day | ORAL | 1 refills | Status: DC
  Filled 2023-01-10: qty 2, 1d supply, fill #0

## 2023-01-10 NOTE — Progress Notes (Signed)
Patient called stating she was unable to pick up the zithromax because her pharmacy said it had to do with her medicaid.  I attempted to reach the [facility] in [place] and was on hold awaiting an answer for 20+ minutes.  Called patient back to see if she would be willing to pick up prescription here at [facility] and patient agrees. [name] RN

## 2023-01-13 NOTE — Addendum Note (Signed)
Addended by: Truett Mainland on: 01/13/2023 09:43 AM   Modules accepted: Orders

## 2023-02-01 ENCOUNTER — Ambulatory Visit (INDEPENDENT_AMBULATORY_CARE_PROVIDER_SITE_OTHER): Payer: Medicaid Other | Admitting: Family Medicine

## 2023-02-01 VITALS — BP 110/81 | HR 86 | Wt 137.0 lb

## 2023-02-01 DIAGNOSIS — D560 Alpha thalassemia: Secondary | ICD-10-CM

## 2023-02-01 DIAGNOSIS — O99012 Anemia complicating pregnancy, second trimester: Secondary | ICD-10-CM

## 2023-02-01 DIAGNOSIS — Z3A18 18 weeks gestation of pregnancy: Secondary | ICD-10-CM

## 2023-02-01 DIAGNOSIS — R87810 Cervical high risk human papillomavirus (HPV) DNA test positive: Secondary | ICD-10-CM

## 2023-02-01 DIAGNOSIS — R87610 Atypical squamous cells of undetermined significance on cytologic smear of cervix (ASC-US): Secondary | ICD-10-CM

## 2023-02-01 DIAGNOSIS — O09292 Supervision of pregnancy with other poor reproductive or obstetric history, second trimester: Secondary | ICD-10-CM

## 2023-02-01 DIAGNOSIS — D508 Other iron deficiency anemias: Secondary | ICD-10-CM

## 2023-02-01 DIAGNOSIS — Z3480 Encounter for supervision of other normal pregnancy, unspecified trimester: Secondary | ICD-10-CM

## 2023-02-01 DIAGNOSIS — O09299 Supervision of pregnancy with other poor reproductive or obstetric history, unspecified trimester: Secondary | ICD-10-CM

## 2023-02-01 NOTE — Progress Notes (Signed)
   PRENATAL VISIT NOTE  Subjective:  Catherine Lee is a 22 y.o. G2P1001 at [redacted]w[redacted]d being seen today for ongoing prenatal care.  She is currently monitored for the following issues for this low-risk pregnancy and has History of intrauterine growth restriction in prior pregnancy, currently pregnant; Supervision of other normal pregnancy, antepartum; and ASCUS with positive high risk HPV cervical on their problem list.  Patient reports  bilateral leg aching in quads that started about 1 week ago .  Contractions: Not present. Vag. Bleeding: None.  Movement: Present. Denies leaking of fluid.   The following portions of the patient's history were reviewed and updated as appropriate: allergies, current medications, past family history, past medical history, past social history, past surgical history and problem list.   Objective:   Vitals:   02/01/23 1357  BP: 110/81  Pulse: 86  Weight: 137 lb (62.1 kg)    Fetal Status: Fetal Heart Rate (bpm): 145   Movement: Present     General:  Alert, oriented and cooperative. Patient is in no acute distress.  Skin: Skin is warm and dry. No rash noted.   Cardiovascular: Normal heart rate noted  Respiratory: Normal respiratory effort, no problems with respiration noted  Abdomen: Soft, gravid, appropriate for gestational age.  Pain/Pressure: Present (pain in legs)     Pelvic: Cervical exam deferred        Extremities: Normal range of motion.  Edema: None  Mental Status: Normal mood and affect. Normal behavior. Normal judgment and thought content.   Assessment and Plan:  Pregnancy: G2P1001 at [redacted]w[redacted]d 1. [redacted] weeks gestation of pregnancy  2. Supervision of other normal pregnancy, antepartum  3. History of intrauterine growth restriction in prior pregnancy, currently pregnant Will watch growth  4. ASCUS with positive high risk HPV cervical PAP in 1 year  5. Iron deficiency anemia secondary to inadequate dietary iron intake Iron supplement   Preterm labor  symptoms and general obstetric precautions including but not limited to vaginal bleeding, contractions, leaking of fluid and fetal movement were reviewed in detail with the patient. Please refer to After Visit Summary for other counseling recommendations.   No follow-ups on file.  Future Appointments  Date Time Provider Department Center  02/10/2023  1:45 PM [facility]5 [facility]  03/02/2023  1:50 PM Reva Bores, MD [facility] None    Levie Heritage, DO

## 2023-02-10 ENCOUNTER — Ambulatory Visit: Payer: Medicaid Other

## 2023-02-21 NOTE — Addendum Note (Signed)
Addended by: Anell Barr on: 02/21/2023 10:01 AM   Modules accepted: Orders

## 2023-03-01 ENCOUNTER — Ambulatory Visit: Payer: Self-pay | Admitting: Maternal & Fetal Medicine

## 2023-03-01 ENCOUNTER — Ambulatory Visit: Payer: Medicaid Other | Attending: Maternal & Fetal Medicine | Admitting: Obstetrics and Gynecology

## 2023-03-01 ENCOUNTER — Other Ambulatory Visit: Payer: Self-pay

## 2023-03-01 DIAGNOSIS — D563 Thalassemia minor: Secondary | ICD-10-CM | POA: Diagnosis not present

## 2023-03-01 NOTE — Progress Notes (Signed)
Virtual Visit via Video Note  I connected with Catherine Lee on 03/01/23 at  2:00 PM EDT by a video enabled telemedicine application and verified that I am speaking with the correct person using two identifiers.  Location: Patient: home Provider: [facility] [place]   I discussed the limitations of evaluation and management by telemedicine and the availability of in person appointments. The patient expressed understanding and agreed to proceed.  Referring provider: Dr. 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  She was present on this virtual visit alone.   Catherine Lee had [employer] carrier screening performed through [place]. 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 The screening also included testing for cystic fibrosis, spinal muscular atrophy and Beta-hemoglobin abnormalities including sickle cell disease and beta thalassemia.  The screening was negative for these conditions, which greatly reduces but cannot eliminate the chance that she is a carrier for these conditions. See that report for details and residual risk estimates.  Alpha thalassemia: Alpha-thalassemia is different in its inheritance compared to other hemoglobinopathies as there are two copies of two alpha globin genes (HBA1 and HBA2) on each chromosome 16, or four alpha globin genes total (aa/aa). A person can be a carrier of one alpha gene mutation (aa/a-), also referred to as a "silent carrier". A person who carries two alpha globin gene mutations can either carry them in cis (both on the same chromosome, denoted as aa/--) or in trans (on different chromosomes, denoted as a-/a-). Alpha-thalassemia carriers of two mutations who have African American ancestry are  more likely to have a trans arrangement (a-/a-); cis configuration is reported to be rare in individuals with African American ancestry.     There are several different forms of alpha-thalassemia. The most severe form of alpha-thalassemia, Hb Barts, is associated with an absence of alpha globin chain synthesis as a result of deletions of all four alpha globin genes (--/--).  Given that Catherine Lee is a silent carrier (aa/a-), her pregnancies would not be at increased risk for Hb Barts, even if her partner is a carrier for alpha-thalassemia, as she will always pass on at least one copy of the alpha globin gene to her children. Hemoglobin H (HbH) disease is caused by three deleted or dysfunctioning alpha globin alleles (a-/--) and is characterized by microcytic hypochromic hemolytic anemia, hepatosplenomegaly, mild jaundice, growth retardation, and sometimes thalassemia-like bone changes. Given Catherine Lee's silent carrier status (aa/a-), the current fetus would only be at risk for HbH disease (a-/--), if her partner is a carrier for two alpha globin mutations in cis (aa/--). If this is the case, the risk for HbH disease in the pregnancy would be 1 in 4 (25%). However, if Catherine Lee's partner is a carrier for two alpha globin mutations, he would be more likely to carry them in trans configuration (a-/a-) than the cis configuration (aa/--), given his ethnicity. If he is a carrier of alpha-thalassemia in trans, then the pregnancy would not be at increased risk for HbH disease and would at most be a carrier of two gene changes in trans (a-/a-).  Carriers may have mild anemia, or small red blood cells (low MCV on CBC), but are expected to be healthy. Based on the carrier frequency for alpha-thalassemia  in the African American population, Catherine Lee's partner has a 1 in 30 chance of being any type of carrier for alpha-thalassemia. We also discussed that if both parents are known to be carriers, then testing during  pregnancy through amniocentesis or CVS would be made available.  Aneuploidy screening: The results of the Panorama cell free DNA testing were also reviewed.  These results showed a less than 1 in 10,000 risk for trisomies 21, 18 and 13, and monosomy X (Turner syndrome). In addition, the risk for triploidy and sex chromosome trisomies (47,XXX and 47,XXY) was also low. While this testing identifies 94-99% of pregnancies with trisomy 72, trisomy 81, and trisomy 49, and >70% of cases of sex chromosome aneuploidies, it is NOT diagnostic. A positive test result requires confirmation by CVS or amniocentesis, and a negative test result does not rule out a fetal chromosome abnormality. This testing does not identify all genetic conditions. The fetal gender is predicted to be female.  If screening for open neural tube defects is desired, we would recommend a maternal serum AFP only in the second trimester to be drawn through her OB.  Family/Pregnancy history: We also obtained a detailed family history and pregnancy history.  The patient stated that this is her second pregnancy, the first with her current partner, [name].  She denied any complications or exposure to medications, tobacco, alcohol or recreational drugs. The family history was unremarkable for birth defects, intellectual disabilities, recurrent pregnancy loss or known genetic conditions.  Plan of care: Catherine Lee stated that she would like to speak with her partner about testing.  If desired, she may reach out to our clinic to arrange for a lab only visit or to have a saliva test kit mailed to his home.  His name is Engineer, manufacturing.  If he declines testing, then we would recommend making the pediatrician aware of this history and testing can be arranged if appropriate.  We appreciate being involved in the care of this patient and can be reached at [phone] with any questions or concerns.   I provided 30 minutes of non-face-to-face time during this  encounter.   Katrina Stack

## 2023-03-02 ENCOUNTER — Ambulatory Visit (INDEPENDENT_AMBULATORY_CARE_PROVIDER_SITE_OTHER): Payer: Medicaid Other | Admitting: Family Medicine

## 2023-03-02 ENCOUNTER — Other Ambulatory Visit (HOSPITAL_COMMUNITY)
Admission: RE | Admit: 2023-03-02 | Discharge: 2023-03-02 | Disposition: A | Discharge status: Home / Self Care | Payer: Medicaid Other | Source: Ambulatory Visit | Attending: Family Medicine | Admitting: Family Medicine

## 2023-03-02 VITALS — BP 137/69 | HR 104 | Wt 146.0 lb

## 2023-03-02 DIAGNOSIS — A749 Chlamydial infection, unspecified: Secondary | ICD-10-CM

## 2023-03-02 DIAGNOSIS — O98812 Other maternal infectious and parasitic diseases complicating pregnancy, second trimester: Secondary | ICD-10-CM

## 2023-03-02 DIAGNOSIS — Z3480 Encounter for supervision of other normal pregnancy, unspecified trimester: Secondary | ICD-10-CM

## 2023-03-02 DIAGNOSIS — R87810 Cervical high risk human papillomavirus (HPV) DNA test positive: Secondary | ICD-10-CM

## 2023-03-02 DIAGNOSIS — Z3A22 22 weeks gestation of pregnancy: Secondary | ICD-10-CM

## 2023-03-02 DIAGNOSIS — R87610 Atypical squamous cells of undetermined significance on cytologic smear of cervix (ASC-US): Secondary | ICD-10-CM

## 2023-03-02 DIAGNOSIS — O98819 Other maternal infectious and parasitic diseases complicating pregnancy, unspecified trimester: Secondary | ICD-10-CM | POA: Diagnosis present

## 2023-03-02 MED ORDER — PRENATAL VITAMIN 27-0.8 MG PO TABS
1.0000 | ORAL_TABLET | Freq: Every day | ORAL | 3 refills | Status: AC
Start: 2023-03-02 — End: ?

## 2023-03-02 NOTE — Progress Notes (Signed)
   PRENATAL VISIT NOTE  Subjective:  Catherine Lee is a 22 y.o. G2P1001 at [redacted]w[redacted]d being seen today for ongoing prenatal care.  She is currently monitored for the following issues for this low-risk pregnancy and has History of intrauterine growth restriction in prior pregnancy, currently pregnant; Supervision of other normal pregnancy, antepartum; and ASCUS with positive high risk HPV cervical on their problem list.  Patient reports no complaints.  Contractions: Not present. Vag. Bleeding: Scant (pt noticed once with wiping).  Movement: Present. Denies leaking of fluid.   The following portions of the patient's history were reviewed and updated as appropriate: allergies, current medications, past family history, past medical history, past social history, past surgical history and problem list.   Objective:   Vitals:   03/02/23 1407  BP: 137/69  Pulse: (!) 104  Weight: 146 lb (66.2 kg)    Fetal Status: Fetal Heart Rate (bpm): 142 Fundal Height: 21 cm Movement: Present     General:  Alert, oriented and cooperative. Patient is in no acute distress.  Skin: Skin is warm and dry. No rash noted.   Cardiovascular: Normal heart rate noted  Respiratory: Normal respiratory effort, no problems with respiration noted  Abdomen: Soft, gravid, appropriate for gestational age.  Pain/Pressure: Absent     Pelvic: Cervical exam deferred        Extremities: Normal range of motion.  Edema: None  Mental Status: Normal mood and affect. Normal behavior. Normal judgment and thought content.   Assessment and Plan:  Pregnancy: G2P1001 at [redacted]w[redacted]d 1. Supervision of other normal pregnancy, antepartum Continue routine prenatal care. - Prenatal Vit-Fe Fumarate-FA (PRENATAL VITAMIN) 27-0.8 MG TABS; Take 1 tablet by mouth daily.  Dispense: 90 tablet; Refill: 3  2. Chlamydia infection affecting pregnancy, antepartum TOC - GC/Chlamydia probe amp ([place])not at [facility]  3. ASCUS with positive high risk HPV  cervical Will need repeat pap in 1 year.  Preterm labor symptoms and general obstetric precautions including but not limited to vaginal bleeding, contractions, leaking of fluid and fetal movement were reviewed in detail with the patient. Please refer to After Visit Summary for other counseling recommendations.   Return in 4 weeks (on 03/30/2023) for 28 wk labs, [facility].  Future Appointments  Date Time Provider Department Center  03/16/2023  7:30 AM [facility] NURSE [facility]  03/16/2023  7:45 AM [facility] US4 [facility]  03/30/2023  9:55 AM Adrian Blackwater, Rhona Raider, DO [facility] None    Reva Bores, MD

## 2023-03-06 LAB — GC/CHLAMYDIA PROBE AMP (~~LOC~~) NOT AT ARMC
Chlamydia: NEGATIVE
Comment: NEGATIVE
Comment: NORMAL
Neisseria Gonorrhea: NEGATIVE

## 2023-03-16 ENCOUNTER — Ambulatory Visit: Payer: Medicaid Other | Attending: Family Medicine

## 2023-03-16 ENCOUNTER — Other Ambulatory Visit: Payer: Self-pay | Admitting: *Deleted

## 2023-03-16 ENCOUNTER — Ambulatory Visit: Payer: Medicaid Other | Admitting: *Deleted

## 2023-03-16 VITALS — BP 124/69 | HR 85

## 2023-03-16 DIAGNOSIS — O285 Abnormal chromosomal and genetic finding on antenatal screening of mother: Secondary | ICD-10-CM | POA: Diagnosis not present

## 2023-03-16 DIAGNOSIS — Z3A24 24 weeks gestation of pregnancy: Secondary | ICD-10-CM

## 2023-03-16 DIAGNOSIS — D259 Leiomyoma of uterus, unspecified: Secondary | ICD-10-CM

## 2023-03-16 DIAGNOSIS — O3412 Maternal care for benign tumor of corpus uteri, second trimester: Secondary | ICD-10-CM

## 2023-03-16 DIAGNOSIS — Z8759 Personal history of other complications of pregnancy, childbirth and the puerperium: Secondary | ICD-10-CM

## 2023-03-16 DIAGNOSIS — O09292 Supervision of pregnancy with other poor reproductive or obstetric history, second trimester: Secondary | ICD-10-CM | POA: Diagnosis not present

## 2023-03-16 DIAGNOSIS — D563 Thalassemia minor: Secondary | ICD-10-CM

## 2023-03-16 DIAGNOSIS — D560 Alpha thalassemia: Secondary | ICD-10-CM | POA: Diagnosis present

## 2023-03-30 ENCOUNTER — Ambulatory Visit (INDEPENDENT_AMBULATORY_CARE_PROVIDER_SITE_OTHER): Payer: Medicaid Other | Admitting: Family Medicine

## 2023-03-30 VITALS — BP 111/79 | HR 86 | Wt 152.0 lb

## 2023-03-30 DIAGNOSIS — Z1339 Encounter for screening examination for other mental health and behavioral disorders: Secondary | ICD-10-CM | POA: Diagnosis not present

## 2023-03-30 DIAGNOSIS — O09299 Supervision of pregnancy with other poor reproductive or obstetric history, unspecified trimester: Secondary | ICD-10-CM | POA: Diagnosis not present

## 2023-03-30 DIAGNOSIS — Z3480 Encounter for supervision of other normal pregnancy, unspecified trimester: Secondary | ICD-10-CM

## 2023-03-30 DIAGNOSIS — R87610 Atypical squamous cells of undetermined significance on cytologic smear of cervix (ASC-US): Secondary | ICD-10-CM

## 2023-03-30 DIAGNOSIS — R87810 Cervical high risk human papillomavirus (HPV) DNA test positive: Secondary | ICD-10-CM

## 2023-03-30 DIAGNOSIS — Z3A26 26 weeks gestation of pregnancy: Secondary | ICD-10-CM | POA: Diagnosis not present

## 2023-03-30 NOTE — Progress Notes (Signed)
   PRENATAL VISIT NOTE  Subjective:  Catherine Lee is a 22 y.o. G2P1001 at [redacted]w[redacted]d being seen today for ongoing prenatal care.  She is currently monitored for the following issues for this high-risk pregnancy and has History of intrauterine growth restriction in prior pregnancy, currently pregnant; Supervision of other normal pregnancy, antepartum; and ASCUS with positive high risk HPV cervical on their problem list.  Patient reports no complaints.  Contractions: Irritability. Vag. Bleeding: None.  Movement: Present. Denies leaking of fluid.   The following portions of the patient's history were reviewed and updated as appropriate: allergies, current medications, past family history, past medical history, past social history, past surgical history and problem list.   Objective:   Vitals:   03/30/23 0948  BP: 111/79  Pulse: 86  Weight: 152 lb (68.9 kg)    Fetal Status: Fetal Heart Rate (bpm): 148   Movement: Present     General:  Alert, oriented and cooperative. Patient is in no acute distress.  Skin: Skin is warm and dry. No rash noted.   Cardiovascular: Normal heart rate noted  Respiratory: Normal respiratory effort, no problems with respiration noted  Abdomen: Soft, gravid, appropriate for gestational age.  Pain/Pressure: Absent     Pelvic: Cervical exam deferred        Extremities: Normal range of motion.  Edema: None  Mental Status: Normal mood and affect. Normal behavior. Normal judgment and thought content.   Assessment and Plan:  Pregnancy: G2P1001 at [redacted]w[redacted]d 1. Supervision of other normal pregnancy, antepartum FHT and FH normal  2. History of intrauterine growth restriction in prior pregnancy, currently pregnant Growth [place]  3. ASCUS with positive high risk HPV cervical Rpt PAP in 1 year  Preterm labor symptoms and general obstetric precautions including but not limited to vaginal bleeding, contractions, leaking of fluid and fetal movement were reviewed in detail with the  patient. Please refer to After Visit Summary for other counseling recommendations.   No follow-ups on file.  Future Appointments  Date Time Provider Department Center  04/12/2023 10:55 AM Milas Hock, MD [facility] None  04/25/2023 10:35 AM Marny Lowenstein, PA-C [facility] None  04/28/2023  9:45 AM [facility] US5 [facility]  05/11/2023 10:35 AM Levie Heritage, DO [facility] None    Levie Heritage, DO

## 2023-04-12 ENCOUNTER — Ambulatory Visit (INDEPENDENT_AMBULATORY_CARE_PROVIDER_SITE_OTHER): Payer: Medicaid Other | Admitting: Obstetrics and Gynecology

## 2023-04-12 VITALS — BP 112/69 | HR 80 | Wt 154.0 lb

## 2023-04-12 DIAGNOSIS — O09293 Supervision of pregnancy with other poor reproductive or obstetric history, third trimester: Secondary | ICD-10-CM

## 2023-04-12 DIAGNOSIS — Z23 Encounter for immunization: Secondary | ICD-10-CM

## 2023-04-12 DIAGNOSIS — O99013 Anemia complicating pregnancy, third trimester: Secondary | ICD-10-CM

## 2023-04-12 DIAGNOSIS — O09299 Supervision of pregnancy with other poor reproductive or obstetric history, unspecified trimester: Secondary | ICD-10-CM

## 2023-04-12 DIAGNOSIS — Z3A28 28 weeks gestation of pregnancy: Secondary | ICD-10-CM

## 2023-04-12 DIAGNOSIS — Z3480 Encounter for supervision of other normal pregnancy, unspecified trimester: Secondary | ICD-10-CM

## 2023-04-12 NOTE — Progress Notes (Signed)
   PRENATAL VISIT NOTE  Subjective:  Catherine Lee is a 22 y.o. G2P1001 at [redacted]w[redacted]d being seen today for ongoing prenatal care.  She is currently monitored for the following issues for this low-risk pregnancy and has History of intrauterine growth restriction in prior pregnancy, currently pregnant; Supervision of other normal pregnancy, antepartum; and ASCUS with positive high risk HPV cervical on their problem list.  Patient reports no complaints.  Contractions: Not present. Vag. Bleeding: None.  Movement: Present. Denies leaking of fluid.   The following portions of the patient's history were reviewed and updated as appropriate: allergies, current medications, past family history, past medical history, past social history, past surgical history and problem list.   Objective:   Vitals:   04/12/23 0913  BP: 112/69  Pulse: 80  Weight: 154 lb (69.9 kg)    Fetal Status:     Movement: Present     General:  Alert, oriented and cooperative. Patient is in no acute distress.  Skin: Skin is warm and dry. No rash noted.   Cardiovascular: Normal heart rate noted  Respiratory: Normal respiratory effort, no problems with respiration noted  Abdomen: Soft, gravid, appropriate for gestational age.  Pain/Pressure: Absent     Pelvic: Cervical exam deferred        Extremities: Normal range of motion.  Edema: None  Mental Status: Normal mood and affect. Normal behavior. Normal judgment and thought content.   Assessment and Plan:  Pregnancy: G2P1001 at [redacted]w[redacted]d 1. Supervision of other normal pregnancy, antepartum GTT today. TDAP done.  - Glucose Tolerance, 2 Hours w/1 Hour - RPR - HIV antibody (with reflex) - CBC  2. [redacted] weeks gestation of pregnancy  3. History of intrauterine growth restriction in prior pregnancy, currently pregnant Has growth scheduled for 7/5.   Preterm labor symptoms and general obstetric precautions including but not limited to vaginal bleeding, contractions, leaking of fluid and  fetal movement were reviewed in detail with the patient. Please refer to After Visit Summary for other counseling recommendations.   No follow-ups on file.  Future Appointments  Date Time Provider Department Center  04/12/2023 10:55 AM Milas Hock, MD [facility] None  04/25/2023 10:35 AM Marny Lowenstein, PA-C [facility] None  04/28/2023  9:45 AM [facility] US5 [facility]  05/11/2023 10:35 AM Levie Heritage, DO [facility] None  05/25/2023  9:55 AM Anyanwu, Jethro Bastos, MD [facility] None  06/08/2023  9:55 AM Levie Heritage, DO [facility] None  06/22/2023  9:55 AM Adrian Blackwater Rhona Raider, DO [facility] None    Milas Hock, MD

## 2023-04-14 LAB — CBC
Hematocrit: 30.8 % — ABNORMAL LOW (ref 34.0–46.6)
Hemoglobin: 9.9 g/dL — ABNORMAL LOW (ref 11.1–15.9)
MCH: 28.4 pg (ref 26.6–33.0)
MCHC: 32.1 g/dL (ref 31.5–35.7)
MCV: 89 fL (ref 79–97)
Platelets: 222 10*3/uL (ref 150–450)
RBC: 3.48 x10E6/uL — ABNORMAL LOW (ref 3.77–5.28)
RDW: 12.1 % (ref 11.7–15.4)
WBC: 6.9 10*3/uL (ref 3.4–10.8)

## 2023-04-14 LAB — HIV ANTIBODY (ROUTINE TESTING W REFLEX): HIV Screen 4th Generation wRfx: NONREACTIVE

## 2023-04-14 LAB — GLUCOSE TOLERANCE, 2 HOURS W/ 1HR
Glucose, 1 hour: 145 mg/dL (ref 70–179)
Glucose, Fasting: 72 mg/dL (ref 70–91)

## 2023-04-14 LAB — RPR: RPR Ser Ql: NONREACTIVE

## 2023-04-18 MED ORDER — FERROUS SULFATE 324 (65 FE) MG PO TBEC
1.0000 | DELAYED_RELEASE_TABLET | ORAL | 1 refills | Status: AC
Start: 2023-04-18 — End: ?

## 2023-04-18 NOTE — Addendum Note (Signed)
Addended by: Milas Hock A on: 04/18/2023 11:52 AM   Modules accepted: Orders

## 2023-04-21 DIAGNOSIS — D563 Thalassemia minor: Secondary | ICD-10-CM | POA: Insufficient documentation

## 2023-04-25 ENCOUNTER — Encounter: Payer: Self-pay | Admitting: Medical

## 2023-04-25 ENCOUNTER — Ambulatory Visit (INDEPENDENT_AMBULATORY_CARE_PROVIDER_SITE_OTHER): Payer: Medicaid Other | Admitting: Medical

## 2023-04-25 VITALS — BP 119/70 | HR 95 | Wt 153.0 lb

## 2023-04-25 DIAGNOSIS — O09299 Supervision of pregnancy with other poor reproductive or obstetric history, unspecified trimester: Secondary | ICD-10-CM

## 2023-04-25 DIAGNOSIS — R87610 Atypical squamous cells of undetermined significance on cytologic smear of cervix (ASC-US): Secondary | ICD-10-CM

## 2023-04-25 DIAGNOSIS — Z3A30 30 weeks gestation of pregnancy: Secondary | ICD-10-CM

## 2023-04-25 DIAGNOSIS — R87810 Cervical high risk human papillomavirus (HPV) DNA test positive: Secondary | ICD-10-CM

## 2023-04-25 DIAGNOSIS — Z3480 Encounter for supervision of other normal pregnancy, unspecified trimester: Secondary | ICD-10-CM

## 2023-04-25 DIAGNOSIS — D563 Thalassemia minor: Secondary | ICD-10-CM

## 2023-04-25 NOTE — Progress Notes (Signed)
   PRENATAL VISIT NOTE  Subjective:  Catherine Lee is a 22 y.o. G2P1001 at [redacted]w[redacted]d being seen today for ongoing prenatal care.  She is currently monitored for the following issues for this low-risk pregnancy and has History of intrauterine growth restriction in prior pregnancy, currently pregnant; Supervision of other normal pregnancy, antepartum; ASCUS with positive high risk HPV cervical; and Alpha thalassemia silent carrier on their problem list.  Patient reports occasional contractions.  Contractions: Not present. Vag. Bleeding: None.  Movement: Present. Denies leaking of fluid.   The following portions of the patient's history were reviewed and updated as appropriate: allergies, current medications, past family history, past medical history, past social history, past surgical history and problem list.   Objective:   Vitals:   04/25/23 1034  BP: 119/70  Pulse: 95  Weight: 153 lb (69.4 kg)    Fetal Status:   Fundal Height: 29 cm Movement: Present     General:  Alert, oriented and cooperative. Patient is in no acute distress.  Skin: Skin is warm and dry. No rash noted.   Cardiovascular: Normal heart rate noted  Respiratory: Normal respiratory effort, no problems with respiration noted  Abdomen: Soft, gravid, appropriate for gestational age.  Pain/Pressure: Present     Pelvic: Cervical exam deferred        Extremities: Normal range of motion.  Edema: None  Mental Status: Normal mood and affect. Normal behavior. Normal judgment and thought content.   Assessment and Plan:  Pregnancy: G2P1001 at [redacted]w[redacted]d 1. Supervision of other normal pregnancy, antepartum - MOC, planning IP Nexplanon  - Did not complete 3rd draw for 2 hour, fasting and 1 hour values were normal. Patient prefers not to repeat. Likelihood of abnormal results based on patient history and results available is low.  - Taking PO iron   2. Alpha thalassemia silent carrier - Has seen GC  3. ASCUS with positive high risk HPV  cervical  4. History of intrauterine growth restriction in prior pregnancy, currently pregnant - Follow-up US scheduled 7/5  5. [redacted] weeks gestation of pregnancy  Preterm labor symptoms and general obstetric precautions including but not limited to vaginal bleeding, contractions, leaking of fluid and fetal movement were reviewed in detail with the patient. Please refer to After Visit Summary for other counseling recommendations.   Return in about 2 weeks (around 05/09/2023) for LOB, In-Person, any provider.  Future Appointments  Date Time Provider Department Center  04/28/2023  9:45 AM [facility] US5 [facility]  05/11/2023 10:35 AM Levie Heritage, DO [facility] None  05/25/2023  9:55 AM Anyanwu, Jethro Bastos, MD [facility] None  06/08/2023  9:55 AM Levie Heritage, DO [facility] None  06/22/2023  9:55 AM Adrian Blackwater Rhona Raider, DO [facility] None    Vonzella Nipple, PA-C

## 2023-04-28 ENCOUNTER — Other Ambulatory Visit: Payer: Self-pay | Admitting: *Deleted

## 2023-04-28 ENCOUNTER — Ambulatory Visit: Payer: Medicaid Other | Attending: Maternal & Fetal Medicine

## 2023-04-28 DIAGNOSIS — O09293 Supervision of pregnancy with other poor reproductive or obstetric history, third trimester: Secondary | ICD-10-CM | POA: Diagnosis not present

## 2023-04-28 DIAGNOSIS — Z8759 Personal history of other complications of pregnancy, childbirth and the puerperium: Secondary | ICD-10-CM

## 2023-04-28 DIAGNOSIS — Z8719 Personal history of other diseases of the digestive system: Secondary | ICD-10-CM

## 2023-04-28 DIAGNOSIS — O285 Abnormal chromosomal and genetic finding on antenatal screening of mother: Secondary | ICD-10-CM | POA: Diagnosis not present

## 2023-04-28 DIAGNOSIS — D259 Leiomyoma of uterus, unspecified: Secondary | ICD-10-CM

## 2023-04-28 DIAGNOSIS — O3413 Maternal care for benign tumor of corpus uteri, third trimester: Secondary | ICD-10-CM | POA: Diagnosis not present

## 2023-04-28 DIAGNOSIS — D563 Thalassemia minor: Secondary | ICD-10-CM

## 2023-04-28 DIAGNOSIS — Z3A30 30 weeks gestation of pregnancy: Secondary | ICD-10-CM

## 2023-04-28 DIAGNOSIS — O3412 Maternal care for benign tumor of corpus uteri, second trimester: Secondary | ICD-10-CM | POA: Diagnosis present

## 2023-05-02 ENCOUNTER — Telehealth: Payer: Self-pay

## 2023-05-02 NOTE — Telephone Encounter (Signed)
Patient called stating she lost her mucus plug. Patient states she is feeling pressure but denies contractions. Advised patient to go to MAU at [address] [facility].Understanding was voiced. A message will be sent to the provider. Oralia Criger l Lela Gell, CMA

## 2023-05-03 ENCOUNTER — Other Ambulatory Visit: Payer: Self-pay

## 2023-05-03 ENCOUNTER — Encounter (HOSPITAL_COMMUNITY): Payer: Self-pay | Admitting: Obstetrics and Gynecology

## 2023-05-03 ENCOUNTER — Observation Stay (HOSPITAL_COMMUNITY)
Admission: AD | Admit: 2023-05-03 | Discharge: 2023-05-04 | Disposition: A | Discharge status: Home / Self Care | Payer: Medicaid Other | Attending: Obstetrics & Gynecology | Admitting: Obstetrics & Gynecology

## 2023-05-03 DIAGNOSIS — Z3A31 31 weeks gestation of pregnancy: Secondary | ICD-10-CM | POA: Insufficient documentation

## 2023-05-03 DIAGNOSIS — J45909 Unspecified asthma, uncomplicated: Secondary | ICD-10-CM | POA: Insufficient documentation

## 2023-05-03 DIAGNOSIS — O4703 False labor before 37 completed weeks of gestation, third trimester: Principal | ICD-10-CM | POA: Insufficient documentation

## 2023-05-03 DIAGNOSIS — Z3480 Encounter for supervision of other normal pregnancy, unspecified trimester: Principal | ICD-10-CM

## 2023-05-03 DIAGNOSIS — O99513 Diseases of the respiratory system complicating pregnancy, third trimester: Secondary | ICD-10-CM | POA: Insufficient documentation

## 2023-05-03 DIAGNOSIS — Z79899 Other long term (current) drug therapy: Secondary | ICD-10-CM | POA: Insufficient documentation

## 2023-05-03 LAB — CBC
HCT: 30.3 % — ABNORMAL LOW (ref 36.0–46.0)
Hemoglobin: 10.1 g/dL — ABNORMAL LOW (ref 12.0–15.0)
MCH: 29.1 pg (ref 26.0–34.0)
MCHC: 33.3 g/dL (ref 30.0–36.0)
MCV: 87.3 fL (ref 80.0–100.0)
Platelets: 220 10*3/uL (ref 150–400)
RBC: 3.47 MIL/uL — ABNORMAL LOW (ref 3.87–5.11)
RDW: 13 % (ref 11.5–15.5)
WBC: 7.5 10*3/uL (ref 4.0–10.5)
nRBC: 0 % (ref 0.0–0.2)

## 2023-05-03 LAB — URINALYSIS, ROUTINE W REFLEX MICROSCOPIC
Bilirubin Urine: NEGATIVE
Glucose, UA: NEGATIVE mg/dL
Hgb urine dipstick: NEGATIVE
Ketones, ur: 80 mg/dL — AB
Leukocytes,Ua: NEGATIVE
Nitrite: NEGATIVE
Protein, ur: NEGATIVE mg/dL
Specific Gravity, Urine: 1.021 (ref 1.005–1.030)
pH: 5 (ref 5.0–8.0)

## 2023-05-03 LAB — TYPE AND SCREEN: Antibody Screen: NEGATIVE

## 2023-05-03 MED ORDER — MAGNESIUM SULFATE 40 GM/1000ML IV SOLN
2.0000 g/h | INTRAVENOUS | Status: DC
  Administered 2023-05-03: 2 g/h via INTRAVENOUS
  Filled 2023-05-03: qty 1000

## 2023-05-03 MED ORDER — MAGNESIUM SULFATE 40 GM/1000ML IV SOLN: 2.0000 g/h | INTRAVENOUS | Status: AC

## 2023-05-03 MED ORDER — ACETAMINOPHEN 325 MG PO TABS: 650.0000 mg | ORAL_TABLET | ORAL | Status: DC | PRN

## 2023-05-03 MED ORDER — DOCUSATE SODIUM 100 MG PO CAPS
100.0000 mg | ORAL_CAPSULE | Freq: Every day | ORAL | Status: DC
  Administered 2023-05-03 – 2023-05-04 (×2): 100 mg via ORAL
  Filled 2023-05-03 (×2): qty 1

## 2023-05-03 MED ORDER — NIFEDIPINE 10 MG PO CAPS
10.0000 mg | ORAL_CAPSULE | ORAL | Status: AC | PRN
  Administered 2023-05-03 (×4): 10 mg via ORAL
  Filled 2023-05-03 (×4): qty 1

## 2023-05-03 MED ORDER — LACTATED RINGERS IV SOLN: Freq: Once | INTRAVENOUS | Status: AC

## 2023-05-03 MED ORDER — CALCIUM CARBONATE ANTACID 500 MG PO CHEW: 2.0000 | CHEWABLE_TABLET | ORAL | Status: DC | PRN

## 2023-05-03 MED ORDER — LACTATED RINGERS IV SOLN: INTRAVENOUS | Status: DC

## 2023-05-03 MED ORDER — NIFEDIPINE 10 MG PO CAPS: 10.0000 mg | ORAL_CAPSULE | Freq: Four times a day (QID) | ORAL | Status: DC | PRN

## 2023-05-03 MED ORDER — MAGNESIUM SULFATE BOLUS VIA INFUSION
4.0000 g | Freq: Once | INTRAVENOUS | Status: AC
  Administered 2023-05-03: 4 g via INTRAVENOUS
  Filled 2023-05-03: qty 1000

## 2023-05-03 MED ORDER — BETAMETHASONE SOD PHOS & ACET 6 (3-3) MG/ML IJ SUSP
12.0000 mg | Freq: Once | INTRAMUSCULAR | Status: AC
  Administered 2023-05-03: 12 mg via INTRAMUSCULAR
  Filled 2023-05-03: qty 5

## 2023-05-03 MED ORDER — PRENATAL MULTIVITAMIN CH
1.0000 | ORAL_TABLET | Freq: Every day | ORAL | Status: DC
  Administered 2023-05-03 – 2023-05-04 (×2): 1 via ORAL
  Filled 2023-05-03 (×2): qty 1

## 2023-05-03 MED ORDER — LACTATED RINGERS IV SOLN
125.0000 mL/h | INTRAVENOUS | Status: AC
  Administered 2023-05-03: 125 mL/h via INTRAVENOUS

## 2023-05-03 NOTE — Progress Notes (Signed)
Magnesium infusion not attached to pt.  RN stopped infusion and re-administered mag bolus.  New start time 67.  MD notified.

## 2023-05-03 NOTE — H&P (Signed)
Chief Complaint:  Contractions   Event Date/Time   First Provider Initiated Contact with Patient 05/03/23 0232     HPI: Catherine Lee is a 22 y.o. G2P1001 at 105w4dwho presents to maternity admissions reporting preterm uterine contractions.  Was seen earlier tonight at [facility]  for same and treated with one dose of Procardia, IV fluids, Penicillin and betamethasone.  .They planned to send her to [facility] since her cervix changed from closed to 1cm but she declined and left there to come here.  She reports good fetal movement, denies LOF, vaginal bleeding, vaginal itching/burning, urinary symptoms, h/a, dizziness, n/v, diarrhea, constipation or fever/chills.    Abdominal Pain This is a new problem. The current episode started today. The problem occurs intermittently. The quality of the pain is cramping. Pertinent negatives include no constipation, diarrhea, dysuria, fever or frequency. Nothing aggravates the pain. The pain is relieved by Nothing.   RN Note:  Pt says called the office this am - Dr Adrian Blackwater-- bc mucus plug came out- told to wait.    Then tonight at 7pm- went to  [facility] - bc she started feeling UC's  ( 5/10 ) and pressure - cramps - they admitted- VE- closed . On monitor- IV,then another VE after 2-3 hrs - dilated to 1 cm.   Started PCN , gave steriod shot and pill to stop UC's .    Was going to transfer to [facility] - but pt wanted to come here . So they D/C her . And she came here.    So now she feels pressure and UC's ( pain 4/10 )  Last sex- 1 mth ago     Past Medical History: Past Medical History:  Diagnosis Date   Asthma    Chlamydia    H/O seasonal allergies     Past obstetric history: OB History  Gravida Para Term Preterm AB Living  2 1 1     1   SAB IAB Ectopic Multiple Live Births        0 1    # Outcome Date GA Lbr Len/2nd Weight Sex Delivery Anes PTL Lv  2 Current           1 Term 11/04/16 [redacted]w[redacted]d 05:16 / 00:04 2775 g F  Vag-Spont None  LIV    Past Surgical History: Past Surgical History:  Procedure Laterality Date   HERNIA REPAIR     umbilical hernia    Family History: Family History  Problem Relation Age of Onset   Hypertension Mother    Asthma Mother    Kidney disease Paternal Aunt    Heart disease Maternal Grandmother    Asthma Maternal Grandmother    Diabetes Maternal Grandmother    Cancer Maternal Grandfather        liver, kidney, prostate   Hearing loss Neg Hx     Social History: Social History   Tobacco Use   Smoking status: Never   Smokeless tobacco: Never  Vaping Use   Vaping Use: Never used  Substance Use Topics   Alcohol use: No   Drug use: No    Allergies: No Known Allergies  Meds:  Medications Prior to Admission  Medication Sig Dispense Refill Last Dose   ferrous sulfate 324 (65 Fe) MG TBEC Take 1 tablet (325 mg total) by mouth every other day. 30 tablet 1 05/02/2023   Prenatal Vit-Fe Fumarate-FA (PRENATAL VITAMIN) 27-0.8 MG TABS Take 1 tablet by mouth daily. 90 tablet 3 05/02/2023  azithromycin (ZITHROMAX) 500 MG tablet Take 2 tablets (1,000 mg total) by mouth once. (Patient not taking: Reported on 03/16/2023) 2 tablet 1    ibuprofen (ADVIL,MOTRIN) 600 MG tablet Take 1 tablet (600 mg total) by mouth every 6 (six) hours as needed. (Patient not taking: Reported on 12/29/2022) 30 tablet 0    ondansetron (ZOFRAN) 4 MG tablet Take 1 tablet (4 mg total) by mouth every 6 (six) hours as needed for nausea or vomiting. (Patient not taking: Reported on 12/29/2022) 12 tablet 0     I have reviewed patient's Past Medical Hx, Surgical Hx, Family Hx, Social Hx, medications and allergies.   ROS:  Review of Systems  Constitutional:  Negative for fever.  Gastrointestinal:  Positive for abdominal pain. Negative for constipation and diarrhea.  Genitourinary:  Negative for dysuria and frequency.   Other systems negative  Physical Exam  Patient Vitals for the past 24 hrs:  BP Temp Temp  src Pulse Resp Height Weight  05/03/23 0202 129/76 98.2 F (36.8 C) Oral (!) 108 16 5\' 11"  (1.803 m) 71.5 kg   Constitutional: Well-developed, well-nourished female in no acute distress.  Cardiovascular: normal rate  Respiratory: normal effort GI: Abd soft, non-tender, gravid appropriate for gestational age.   No rebound or guarding. MS: Extremities nontender, no edema, normal ROM Neurologic: Alert and oriented x 4.  GU: Neg CVAT.  PELVIC EXAM: Dilation: 1 Effacement (%): 70 Station: -1 Presentation: Vertex Exam by:: Wynelle Bourgeois, CNM    FHT:  Baseline 135 , moderate variability, accelerations present, no decelerations Contractions: q 2-4 mins Irregular     Labs:  B/Positive/-- (03/07 1438)  Imaging:    MAU Course/MDM: I have reviewed the triage vital signs and the nursing notes.   Pertinent labs & imaging results that were available during my care of the patient were reviewed by me and considered in my medical decision making (see chart for details).      I have reviewed her medical records including past results, notes and treatments.   NST reviewed, reassuring with preterm contractions Consult Dr Jolayne Panther with presentation, exam findings and test results.  Treatments in MAU included Procardia series, IV Fluids.  The contractions persisted despite treatments  Has already gotten first dose of Betamethasone and one dose of Penicillin at [facility]   Assessment: Single IUP at [redacted]w[redacted]d Preterm labor   Plan: Admit to Labor and Delivery per Dr Jolayne Panther Magnesium Sulfate for neuroprophylaxis PTL precautions Labor team to follow  Wynelle Bourgeois CNM, MSN Certified Nurse-Midwife 05/03/2023 2:32 AM

## 2023-05-03 NOTE — Progress Notes (Addendum)
Labor Progress Note  Catherine Lee is a 22 y.o. G2P1001 at [redacted]w[redacted]d presented for pre-term labor  S: patient is feeling drowsy from the Magnesium, but is otherwise well.  O:  BP 112/70   Pulse 94   Temp 98 F (36.7 C)   Resp 20   Ht 5\' 11"  (1.803 m)   Wt 71.5 kg   LMP 09/24/2022   SpO2 100%   BMI 21.99 kg/m  EFM:110 bpm/Moderate variability/ none accels/ None decels CAT: 1 Toco: none   CVE: Dilation: 2.5 Effacement (%): 50 Station: -2 Presentation: Vertex Exam by:: Catherine Lee   A&P: 22 y.o. G2P1001 [redacted]w[redacted]d  here for pre-term labor as above  #Labor: Dilation and effacement has stayed the same since 12 PM, which is reassuring that she is not in active labor. She is ready to be transferred to [facility]. Will need a second dose of Betamethasone. Cont Mag gtt until after 2nd dose BMZ. #Pain: per pt request #FWB: CAT 1 #GBS not done- PCN   Oscar La, Medical Student 05/03/23  7:53 PM __ GME ATTESTATION:  Evaluation and management procedures were performed by the [facility] Medicine Resident under my supervision. I was immediately available for direct supervision, assistance and direction throughout this encounter.  I also confirm that I have verified the information documented in the resident's note, and that I have also personally reperformed the pertinent components of the physical exam and all of the medical decision making activities.  I have also made any necessary editorial changes.  Myrtie Hawk, DO OB Fellow, Faculty [facility], Center for [facility] 05/03/2023 8:00 PM

## 2023-05-03 NOTE — Progress Notes (Addendum)
Labor Progress Note  Catherine Lee is a 22 y.o. G2P1001 at [redacted]w[redacted]d presented for pre-term labor  S: She is doing fine. Still having painful contractions. Has no complaints at this time, but is worried about baby being preterm  O:  BP 110/62   Pulse 84   Temp 98.3 F (36.8 C) (Oral)   Resp 16   Ht 5\' 11"  (1.803 m)   Wt 71.5 kg   LMP 09/24/2022   SpO2 100%   BMI 21.99 kg/m  EFM:135 bpm/Moderate variability/ 15x15 accels/ None decels CAT: 1 Toco: none   CVE: Dilation: 2.5 Effacement (%): 50 Station: -2 Presentation: Vertex Exam by:: [name]   A&P: 22 y.o. G2P1001 [redacted]w[redacted]d  here for pre-term labor as above  #Labor:  Patient has dilated about 1.5 cm since admission earlier this morning (0232 AM). Will want to continue Penicillin and betamethasone, since she is pre-term and want to discourage her progression into active labor. Steroids are also to continue to encourage baby's lung development, though she is past the point where we would be concerned about surfactant deficiency (24 weeks). Will monitor FHT to see how baby is doing. Will plan to re-check her cervix tonight. #Pain: tylenol PRN #FWB: CAT 1 #GBS not done needs to be done here in case she goes into active labor.    Oscar La, Medical Student 05/03/23  12:53 PM

## 2023-05-03 NOTE — MAU Provider Note (Signed)
Chief Complaint:  Contractions   Event Date/Time   First Provider Initiated Contact with Patient 05/03/23 0232     HPI: Catherine Lee is a 22 y.o. G2P1001 at 31w4dwho presents to maternity admissions reporting preterm uterine contractions.  Was seen earlier tonight at [facility]  for same and treated with one dose of Procardia, IV fluids, Penicillin and betamethasone.  .They planned to send her to [facility] since her cervix changed from closed to 1cm but she declined and left there to come here.  She reports good fetal movement, denies LOF, vaginal bleeding, vaginal itching/burning, urinary symptoms, h/a, dizziness, n/v, diarrhea, constipation or fever/chills.    Abdominal Pain This is a new problem. The current episode started today. The problem occurs intermittently. The quality of the pain is cramping. Pertinent negatives include no constipation, diarrhea, dysuria, fever or frequency. Nothing aggravates the pain. The pain is relieved by Nothing.   RN Note:  Pt says called the office this am - Dr Stinson-- bc mucus plug came out- told to wait.    Then tonight at 7pm- went to  [facility] - bc she started feeling UC's  ( 5/10 ) and pressure - cramps - they admitted- VE- closed . On monitor- IV,then another VE after 2-3 hrs - dilated to 1 cm.   Started PCN , gave steriod shot and pill to stop UC's .    Was going to transfer to [facility] - but pt wanted to come here . So they D/C her . And she came here.    So now she feels pressure and UC's ( pain 4/10 )  Last sex- 1 mth ago     Past Medical History: Past Medical History:  Diagnosis Date   Asthma    Chlamydia    H/O seasonal allergies     Past obstetric history: OB History  Gravida Para Term Preterm AB Living  2 1 1     1  SAB IAB Ectopic Multiple Live Births        0 1    # Outcome Date GA Lbr Len/2nd Weight Sex Delivery Anes PTL Lv  2 Current           1 Term 11/04/16 [redacted]w[redacted]d 05:16 / 00:04 2775 g F  Vag-Spont None  LIV    Past Surgical History: Past Surgical History:  Procedure Laterality Date   HERNIA REPAIR     umbilical hernia    Family History: Family History  Problem Relation Age of Onset   Hypertension Mother    Asthma Mother    Kidney disease Paternal Aunt    Heart disease Maternal Grandmother    Asthma Maternal Grandmother    Diabetes Maternal Grandmother    Cancer Maternal Grandfather        liver, kidney, prostate   Hearing loss Neg Hx     Social History: Social History   Tobacco Use   Smoking status: Never   Smokeless tobacco: Never  Vaping Use   Vaping Use: Never used  Substance Use Topics   Alcohol use: No   Drug use: No    Allergies: No Known Allergies  Meds:  Medications Prior to Admission  Medication Sig Dispense Refill Last Dose   ferrous sulfate 324 (65 Fe) MG TBEC Take 1 tablet (325 mg total) by mouth every other day. 30 tablet 1 05/02/2023   Prenatal Vit-Fe Fumarate-FA (PRENATAL VITAMIN) 27-0.8 MG TABS Take 1 tablet by mouth daily. 90 tablet 3 05/02/2023     azithromycin (ZITHROMAX) 500 MG tablet Take 2 tablets (1,000 mg total) by mouth once. (Patient not taking: Reported on 03/16/2023) 2 tablet 1    ibuprofen (ADVIL,MOTRIN) 600 MG tablet Take 1 tablet (600 mg total) by mouth every 6 (six) hours as needed. (Patient not taking: Reported on 12/29/2022) 30 tablet 0    ondansetron (ZOFRAN) 4 MG tablet Take 1 tablet (4 mg total) by mouth every 6 (six) hours as needed for nausea or vomiting. (Patient not taking: Reported on 12/29/2022) 12 tablet 0     I have reviewed patient's Past Medical Hx, Surgical Hx, Family Hx, Social Hx, medications and allergies.   ROS:  Review of Systems  Constitutional:  Negative for fever.  Gastrointestinal:  Positive for abdominal pain. Negative for constipation and diarrhea.  Genitourinary:  Negative for dysuria and frequency.   Other systems negative  Physical Exam  Patient Vitals for the past 24 hrs:  BP Temp Temp  src Pulse Resp Height Weight  05/03/23 0202 129/76 98.2 F (36.8 C) Oral (!) 108 16 5' 11" (1.803 m) 71.5 kg   Constitutional: Well-developed, well-nourished female in no acute distress.  Cardiovascular: normal rate  Respiratory: normal effort GI: Abd soft, non-tender, gravid appropriate for gestational age.   No rebound or guarding. MS: Extremities nontender, no edema, normal ROM Neurologic: Alert and oriented x 4.  GU: Neg CVAT.  PELVIC EXAM: Dilation: 1 Effacement (%): 70 Station: -1 Presentation: Vertex Exam by:: Octavie Westerhold, CNM    FHT:  Baseline 135 , moderate variability, accelerations present, no decelerations Contractions: q 2-4 mins Irregular     Labs:  B/Positive/-- (03/07 1438)  Imaging:    MAU Course/MDM: I have reviewed the triage vital signs and the nursing notes.   Pertinent labs & imaging results that were available during my care of the patient were reviewed by me and considered in my medical decision making (see chart for details).      I have reviewed her medical records including past results, notes and treatments.   NST reviewed, reassuring with preterm contractions Consult Dr Constant with presentation, exam findings and test results.  Treatments in MAU included Procardia series, IV Fluids.  The contractions persisted despite treatments  Has already gotten first dose of Betamethasone and one dose of Penicillin at [facility]   Assessment: Single IUP at [redacted]w[redacted]d Preterm labor   Plan: Admit to Labor and Delivery per Dr Constant Magnesium Sulfate for neuroprophylaxis PTL precautions Labor team to follow  Deshia Vanderhoof CNM, MSN Certified Nurse-Midwife 05/03/2023 2:32 AM  

## 2023-05-03 NOTE — MAU Note (Addendum)
Pt says called the office this am - Dr Adrian Blackwater-- bc mucus plug came out- told to wait.   Then tonight at 7pm- went to  [facility] - bc she started feeling UC's  ( 5/10 ) and pressure - cramps - they admitted- VE- closed . On monitor- IV,then another VE after 2-3 hrs - dilated to 1 cm.   Started PCN , gave steriod shot and pill to stop UC's .   Was going to transfer to [facility] - but pt wanted to come here . So they D/C her . And she came here.   So now she feels pressure and UC's ( pain 4/10 )  Last sex- 1 mth ago

## 2023-05-04 DIAGNOSIS — Z3A31 31 weeks gestation of pregnancy: Secondary | ICD-10-CM

## 2023-05-04 LAB — TYPE AND SCREEN: ABO/RH(D): B POS

## 2023-05-04 NOTE — Discharge Summary (Signed)
Patient ID: Catherine Lee MRN: [id number] DOB/AGE: 01-11-2001 22 y.o.  Admit date: 05/03/2023 Discharge date: 05/04/2023  Admission Diagnoses:preterm labor at 31.3. weeks  Discharge Diagnoses: preterm labor undelivered  Prenatal Procedures: none  Consults: Neonatology, Maternal Fetal Medicine  Hospital Course:  This is a 22 y.o. G2P1001 with IUP at [redacted]w[redacted]d admitted for contractions. Pt says called [facility]  7/9 - Dr Adrian Blackwater-- bc mucus plug came out- told to wait.   Then tonight at 7pm- went to  [facility] - bc she started feeling UC's  ( 5/10 ) and pressure - cramps - they admitted- VE- closed . On monitor- IV,then another VE after 2-3 hrs - dilated to 1 cm.   Started PCN , gave steriod shot and pill to stop UC's .  She was admitted with contractions, noted to have a cervical exam of 1 cm which changed to 2.5 on repeat exam.  No leaking of fluid and no bleeding.  She was initially started on magnesium sulfate for tocolysis and neuroprotection and also received betamethasone x 2 doses.  Her tocolysis was transitioned to Procardia.  She was observed, fetal heart rate monitoring remained reassuring, and she had no signs/symptoms of progressing preterm labor or other maternal-fetal concerns.  Her cervical exam was unchanged from admission.  She was deemed stable for discharge to home with outpatient follow up.  Discharge Exam: Temp:  [97.9 F (36.6 C)-98.2 F (36.8 C)] 98.2 F (36.8 C) (07/11 0838) Pulse Rate:  [83-107] 87 (07/11 0838) Resp:  [15-21] 18 (07/11 0838) BP: (87-134)/(44-96) 121/77 (07/11 0838) SpO2:  [100 %] 100 % (07/11 3086) Physical Examination: CONSTITUTIONAL: Well-developed, well-nourished female in no acute distress.  HENT:  Normocephalic, atraumatic, External right and left ear normal. Oropharynx is clear and moist EYES: Conjunctivae and EOM are normal. Pupils are equal, round, and reactive to light. No scleral icterus.  NECK: Normal range of motion, supple, no  masses SKIN: Skin is warm and dry. No rash noted. Not diaphoretic. No erythema. No pallor. NEUROLGIC: Alert and oriented to person, place, and time. Normal reflexes, muscle tone coordination. No cranial nerve deficit noted. PSYCHIATRIC: Normal mood and affect. Normal behavior. Normal judgment and thought content. CARDIOVASCULAR: Normal heart rate noted, regular rhythm RESPIRATORY: Effort and breath sounds normal, no problems with respiration noted MUSCULOSKELETAL: Normal range of motion. No edema and no tenderness. 2+ distal pulses. ABDOMEN: Soft, nontender, nondistended, gravid. CERVIX: Dilation: 2.5 Effacement (%): 50 Cervical Position: Posterior Station: -2 Presentation: Vertex Exam by:: Dr. Camelia Phenes No change on my exam today Fetal monitoring:  Fetal Heart Rate A   Mode External  [removed] filed at 05/03/2023 2236  Baseline Rate (A) 130 bpm filed at 05/03/2023 2236  Variability 6-25 BPM filed at 05/03/2023 2236  Accelerations 15 x 15, 10 x 10 filed at 05/03/2023 2236  Decelerations Variable filed at 05/03/2023 2236   Uterine activity: rare contractions per hour  Significant Diagnostic Studies:  Results for orders placed or performed during the hospital encounter of 05/03/23 (from the past 168 hour(s))  Urinalysis, Routine w reflex microscopic -Urine, Clean Catch   Collection Time: 05/03/23  4:11 AM  Result Value Ref Range   Color, Urine YELLOW YELLOW   APPearance CLEAR CLEAR   Specific Gravity, Urine 1.021 1.005 - 1.030   pH 5.0 5.0 - 8.0   Glucose, UA NEGATIVE NEGATIVE mg/dL   Hgb urine dipstick NEGATIVE NEGATIVE   Bilirubin Urine NEGATIVE NEGATIVE   Ketones, ur 80 (A) NEGATIVE mg/dL   Protein, ur  NEGATIVE NEGATIVE mg/dL   Nitrite NEGATIVE NEGATIVE   Leukocytes,Ua NEGATIVE NEGATIVE  Type and screen [facility]   Collection Time: 05/03/23  4:39 AM  Result Value Ref Range   ABO/RH(D) B POS    Antibody Screen NEG    Sample Expiration       05/06/2023,2359 Performed at [facility], [address], [place], [place] [address]   CBC   Collection Time: 05/03/23  8:32 AM  Result Value Ref Range   WBC 7.5 4.0 - 10.5 K/uL   RBC 3.47 (L) 3.87 - 5.11 MIL/uL   Hemoglobin 10.1 (L) 12.0 - 15.0 g/dL   HCT 60.4 (L) 54.0 - 98.1 %   MCV 87.3 80.0 - 100.0 fL   MCH 29.1 26.0 - 34.0 pg   MCHC 33.3 30.0 - 36.0 g/dL   RDW 19.1 47.8 - 29.5 %   Platelets 220 150 - 400 K/uL   nRBC 0.0 0.0 - 0.2 %    Discharge Condition: Stable  Disposition: Discharge disposition: 01-Home or Self Care        Discharge Instructions     Discharge patient   Complete by: As directed    Discharge disposition: 01-Home or Self Care   Discharge patient date: 05/04/2023      Allergies as of 05/04/2023   No Known Allergies      Medication List     STOP taking these medications    azithromycin 500 MG tablet Commonly known as: Zithromax   ibuprofen 600 MG tablet Commonly known as: ADVIL   ondansetron 4 MG tablet Commonly known as: ZOFRAN       TAKE these medications    ferrous sulfate 324 (65 Fe) MG Tbec Take 1 tablet (325 mg total) by mouth every other day.   Prenatal Vitamin 27-0.8 MG Tabs Take 1 tablet by mouth daily.        Follow-up Information     [facility] Follow up in 1 week(s).   Specialty: Obstetrics and Gynecology Contact information: [address] [place] [address] [phone]                Signed: Scheryl Darter M.D. 05/04/2023, 10:15 AM

## 2023-05-04 NOTE — Progress Notes (Signed)
Pt out with her mom All questions answered  Teaching complete

## 2023-05-11 ENCOUNTER — Ambulatory Visit (INDEPENDENT_AMBULATORY_CARE_PROVIDER_SITE_OTHER): Payer: Medicaid Other | Admitting: Family Medicine

## 2023-05-11 VITALS — BP 129/64 | HR 112 | Wt 157.0 lb

## 2023-05-11 DIAGNOSIS — Z3A32 32 weeks gestation of pregnancy: Secondary | ICD-10-CM

## 2023-05-11 DIAGNOSIS — O09299 Supervision of pregnancy with other poor reproductive or obstetric history, unspecified trimester: Secondary | ICD-10-CM

## 2023-05-11 DIAGNOSIS — D563 Thalassemia minor: Secondary | ICD-10-CM

## 2023-05-11 NOTE — Progress Notes (Signed)
Patient hospitalized for preterm labor 05/03/23 to 05/04/23. Armandina Stammer, RN

## 2023-05-11 NOTE — Progress Notes (Signed)
   PRENATAL VISIT NOTE  Subjective:  Catherine Lee is a 22 y.o. G2P1001 at [redacted]w[redacted]d being seen today for ongoing prenatal care.  She is currently monitored for the following issues for this high-risk pregnancy and has History of intrauterine growth restriction in prior pregnancy, currently pregnant; Supervision of other normal pregnancy, antepartum; ASCUS with positive high risk HPV cervical; Alpha thalassemia silent carrier; [redacted] weeks gestation of pregnancy; Preterm labor in third trimester without delivery; and Preterm labor in third trimester on their problem list.  Patient reports  being hospitalized overnight for preterm ctxs. She received BMZ on 7/10 and 7/11 .  Contractions: Irregular. Vag. Bleeding: Scant (when wiping in bathroom).  Movement: Present. Denies leaking of fluid.   The following portions of the patient's history were reviewed and updated as appropriate: allergies, current medications, past family history, past medical history, past social history, past surgical history and problem list.   Objective:   Vitals:   05/11/23 1036  BP: 129/64  Pulse: (!) 112  Weight: 157 lb (71.2 kg)    Fetal Status: Fetal Heart Rate (bpm): 140   Movement: Present     General:  Alert, oriented and cooperative. Patient is in no acute distress.  Skin: Skin is warm and dry. No rash noted.   Cardiovascular: Normal heart rate noted  Respiratory: Normal respiratory effort, no problems with respiration noted  Abdomen: Soft, gravid, appropriate for gestational age.  Pain/Pressure: Present     Pelvic: Cervical exam deferred        Extremities: Normal range of motion.     Mental Status: Normal mood and affect. Normal behavior. Normal judgment and thought content.   Assessment and Plan:  Pregnancy: G2P1001 at [redacted]w[redacted]d 1. [redacted] weeks gestation of pregnancy Some cramping. No further contractions.   2. History of intrauterine growth restriction in prior pregnancy, currently pregnant Normal growth.  3.  Alpha thalassemia silent carrier   Preterm labor symptoms and general obstetric precautions including but not limited to vaginal bleeding, contractions, leaking of fluid and fetal movement were reviewed in detail with the patient. Please refer to After Visit Summary for other counseling recommendations.   No follow-ups on file.  Future Appointments  Date Time Provider Department Center  05/25/2023  9:55 AM Anyanwu, Jethro Bastos, MD [facility] None  06/08/2023  9:55 AM Levie Heritage, DO [facility] None  06/22/2023  9:55 AM Levie Heritage, DO [facility] None    Levie Heritage, DO

## 2023-05-25 ENCOUNTER — Ambulatory Visit (INDEPENDENT_AMBULATORY_CARE_PROVIDER_SITE_OTHER): Payer: Medicaid Other | Admitting: Obstetrics & Gynecology

## 2023-05-25 VITALS — BP 113/76 | HR 80 | Wt 158.0 lb

## 2023-05-25 DIAGNOSIS — Z3480 Encounter for supervision of other normal pregnancy, unspecified trimester: Secondary | ICD-10-CM

## 2023-05-25 DIAGNOSIS — Z3A34 34 weeks gestation of pregnancy: Secondary | ICD-10-CM

## 2023-05-25 DIAGNOSIS — O09299 Supervision of pregnancy with other poor reproductive or obstetric history, unspecified trimester: Secondary | ICD-10-CM

## 2023-05-25 NOTE — Progress Notes (Signed)
   PRENATAL VISIT NOTE  Subjective:  Catherine Lee is a 22 y.o. G2P1001 at [redacted]w[redacted]d being seen today for ongoing prenatal care.  She is currently monitored for the following issues for this high-risk pregnancy and has History of intrauterine growth restriction in prior pregnancy, currently pregnant; Supervision of other normal pregnancy, antepartum; ASCUS with positive high risk HPV cervical; and Preterm labor in third trimester without delivery on their problem list.  Patient reports no complaints.  Contractions: Irritability. Vag. Bleeding: None.  Movement: Present. Denies leaking of fluid.   The following portions of the patient's history were reviewed and updated as appropriate: allergies, current medications, past family history, past medical history, past social history, past surgical history and problem list.   Objective:   Vitals:   05/25/23 1007  BP: 113/76  Pulse: 80  Weight: 158 lb (71.7 kg)    Fetal Status: Fetal Heart Rate (bpm): 138 Fundal Height: 34 cm Movement: Present     General:  Alert, oriented and cooperative. Patient is in no acute distress.  Skin: Skin is warm and dry. No rash noted.   Cardiovascular: Normal heart rate noted  Respiratory: Normal respiratory effort, no problems with respiration noted  Abdomen: Soft, gravid, appropriate for gestational age.  Pain/Pressure: Present     Pelvic: Cervical exam deferred        Extremities: Normal range of motion.  Edema: None  Mental Status: Normal mood and affect. Normal behavior. Normal judgment and thought content.   Assessment and Plan:  Pregnancy: G2P1001 at [redacted]w[redacted]d 1. History of intrauterine growth restriction in prior pregnancy, currently pregnant Follow up growth scan ordered, will follow up results and manage accordingly. - [place] MFM OB FOLLOW UP; Future  2. [redacted] weeks gestation of pregnancy 3. Supervision of other normal pregnancy, antepartum Preterm labor symptoms and general obstetric precautions including but  not limited to vaginal bleeding, contractions, leaking of fluid and fetal movement were reviewed in detail with the patient. Please refer to After Visit Summary for other counseling recommendations.   Return in about 2 weeks (around 06/08/2023) for Pelvic cultures, OFFICE OB VISIT (MD only).  Future Appointments  Date Time Provider Department Center  05/29/2023  8:45 AM [facility] US6 [facility]  06/08/2023  9:55 AM Levie Heritage, DO [facility] None  06/22/2023  9:55 AM Adrian Blackwater, Rhona Raider, DO [facility] None    Jaynie Collins, MD

## 2023-05-26 ENCOUNTER — Ambulatory Visit: Payer: Medicaid Other

## 2023-05-29 ENCOUNTER — Ambulatory Visit: Payer: Medicaid Other | Attending: Obstetrics

## 2023-05-29 DIAGNOSIS — Z3A35 35 weeks gestation of pregnancy: Secondary | ICD-10-CM | POA: Diagnosis not present

## 2023-05-29 DIAGNOSIS — O09293 Supervision of pregnancy with other poor reproductive or obstetric history, third trimester: Secondary | ICD-10-CM | POA: Diagnosis not present

## 2023-05-29 DIAGNOSIS — O09299 Supervision of pregnancy with other poor reproductive or obstetric history, unspecified trimester: Secondary | ICD-10-CM

## 2023-05-29 DIAGNOSIS — Z3A34 34 weeks gestation of pregnancy: Secondary | ICD-10-CM | POA: Diagnosis not present

## 2023-06-05 ENCOUNTER — Other Ambulatory Visit (HOSPITAL_COMMUNITY)
Admission: RE | Admit: 2023-06-05 | Discharge: 2023-06-05 | Disposition: A | Discharge status: Home / Self Care | Payer: Medicaid Other | Source: Ambulatory Visit | Attending: Family Medicine | Admitting: Family Medicine

## 2023-06-05 ENCOUNTER — Encounter: Payer: Self-pay | Admitting: Obstetrics and Gynecology

## 2023-06-05 ENCOUNTER — Ambulatory Visit (INDEPENDENT_AMBULATORY_CARE_PROVIDER_SITE_OTHER): Payer: Medicaid Other | Admitting: Obstetrics and Gynecology

## 2023-06-05 VITALS — BP 118/76 | HR 91 | Wt 163.0 lb

## 2023-06-05 DIAGNOSIS — Z3480 Encounter for supervision of other normal pregnancy, unspecified trimester: Secondary | ICD-10-CM | POA: Diagnosis present

## 2023-06-05 DIAGNOSIS — Z3A36 36 weeks gestation of pregnancy: Secondary | ICD-10-CM

## 2023-06-05 DIAGNOSIS — O09299 Supervision of pregnancy with other poor reproductive or obstetric history, unspecified trimester: Secondary | ICD-10-CM

## 2023-06-05 NOTE — Progress Notes (Signed)
ROB: Denies any concerns  Wants cervix checked today

## 2023-06-05 NOTE — Progress Notes (Signed)
   PRENATAL VISIT NOTE  Subjective:  Catherine Lee is a 22 y.o. G2P1001 at [redacted]w[redacted]d being seen today for ongoing prenatal care.  She is currently monitored for the following issues for this low-risk pregnancy and has History of intrauterine growth restriction in prior pregnancy, currently pregnant; Supervision of other normal pregnancy, antepartum; ASCUS with positive high risk HPV cervical; and Preterm labor in third trimester without delivery on their problem list.  Patient reports no complaints.  Contractions: Not present. Vag. Bleeding: None.  Movement: Present. Denies leaking of fluid.   The following portions of the patient's history were reviewed and updated as appropriate: allergies, current medications, past family history, past medical history, past social history, past surgical history and problem list.   Objective:   Vitals:   06/05/23 1023  BP: 118/76  Pulse: 91  Weight: 163 lb (73.9 kg)    Fetal Status: Fetal Heart Rate (bpm): 137 Fundal Height: 35 cm Movement: Present  Presentation: Vertex  General:  Alert, oriented and cooperative. Patient is in no acute distress.  Skin: Skin is warm and dry. No rash noted.   Cardiovascular: Normal heart rate noted  Respiratory: Normal respiratory effort, no problems with respiration noted  Abdomen: Soft, gravid, appropriate for gestational age.  Pain/Pressure: Absent     Pelvic: Cervical exam performed in the presence of a chaperone Dilation: 3 Effacement (%): 70 Station: -2  Extremities: Normal range of motion.  Edema: None  Mental Status: Normal mood and affect. Normal behavior. Normal judgment and thought content.   Assessment and Plan:  Pregnancy: G2P1001 at [redacted]w[redacted]d 1. History of intrauterine growth restriction in prior pregnancy, currently pregnant Growth on 8/5 was 30%ile.   2. Preterm labor in third trimester without delivery S/p BMZ  3. Supervision of other normal pregnancy, antepartum Cultures done today.   Preterm labor  symptoms and general obstetric precautions including but not limited to vaginal bleeding, contractions, leaking of fluid and fetal movement were reviewed in detail with the patient. Please refer to After Visit Summary for other counseling recommendations.   Return in about 1 week (around 06/12/2023) for OB VISIT, MD or APP.  Future Appointments  Date Time Provider Department Center  06/22/2023  9:55 AM Levie Heritage, DO [facility] None  06/29/2023 11:15 AM Levie Heritage, DO [facility] None    Milas Hock, MD

## 2023-06-08 ENCOUNTER — Encounter: Payer: Medicaid Other | Admitting: Family Medicine

## 2023-06-16 ENCOUNTER — Inpatient Hospital Stay (HOSPITAL_COMMUNITY)
Admission: AD | Admit: 2023-06-16 | Discharge: 2023-06-17 | Disposition: A | Discharge status: Home / Self Care | Payer: Medicaid Other | Source: Home / Self Care | Attending: Obstetrics & Gynecology | Admitting: Obstetrics & Gynecology

## 2023-06-16 ENCOUNTER — Encounter (HOSPITAL_COMMUNITY): Payer: Self-pay | Admitting: Obstetrics & Gynecology

## 2023-06-16 DIAGNOSIS — O479 False labor, unspecified: Secondary | ICD-10-CM

## 2023-06-16 DIAGNOSIS — O471 False labor at or after 37 completed weeks of gestation: Secondary | ICD-10-CM | POA: Insufficient documentation

## 2023-06-16 DIAGNOSIS — Z3A38 38 weeks gestation of pregnancy: Secondary | ICD-10-CM | POA: Insufficient documentation

## 2023-06-16 NOTE — MAU Note (Signed)
Catherine Lee is a 22 y.o. at [redacted]w[redacted]d here in MAU reporting: ctx that began at 1600 today. Pt states they are 3-5 min apart. Pt reports lower abdominal pain, back pain, and pelvic pressure. Pt denies VB or LOF. +FM   Onset of complaint: 1600 today  Pain score: 6/10 lower ab and back  Vitals:   06/16/23 2205  BP: 130/75  Pulse: (!) 101  Resp: 18  Temp: 98.9 F (37.2 C)  SpO2: 100%     FHT:140 Lab orders placed from triage:

## 2023-06-17 ENCOUNTER — Inpatient Hospital Stay (HOSPITAL_COMMUNITY)
Admission: AD | Admit: 2023-06-17 | Discharge: 2023-06-19 | DRG: 806 | Disposition: A | Discharge status: Home / Self Care | Payer: Medicaid Other | Attending: Obstetrics & Gynecology | Admitting: Obstetrics & Gynecology

## 2023-06-17 ENCOUNTER — Other Ambulatory Visit: Payer: Self-pay

## 2023-06-17 ENCOUNTER — Encounter (HOSPITAL_COMMUNITY): Payer: Self-pay | Admitting: Obstetrics & Gynecology

## 2023-06-17 DIAGNOSIS — Z3A38 38 weeks gestation of pregnancy: Secondary | ICD-10-CM | POA: Diagnosis not present

## 2023-06-17 DIAGNOSIS — O471 False labor at or after 37 completed weeks of gestation: Secondary | ICD-10-CM | POA: Diagnosis present

## 2023-06-17 DIAGNOSIS — Z30017 Encounter for initial prescription of implantable subdermal contraceptive: Secondary | ICD-10-CM

## 2023-06-17 DIAGNOSIS — Z975 Presence of (intrauterine) contraceptive device: Secondary | ICD-10-CM

## 2023-06-17 DIAGNOSIS — O26893 Other specified pregnancy related conditions, third trimester: Secondary | ICD-10-CM | POA: Diagnosis present

## 2023-06-17 DIAGNOSIS — O479 False labor, unspecified: Secondary | ICD-10-CM

## 2023-06-17 LAB — CBC
HCT: 32.4 % — ABNORMAL LOW (ref 36.0–46.0)
Hemoglobin: 10.4 g/dL — ABNORMAL LOW (ref 12.0–15.0)
MCH: 28 pg (ref 26.0–34.0)
MCHC: 32.1 g/dL (ref 30.0–36.0)
MCV: 87.1 fL (ref 80.0–100.0)
Platelets: 223 10*3/uL (ref 150–400)
RBC: 3.72 MIL/uL — ABNORMAL LOW (ref 3.87–5.11)
RDW: 13.3 % (ref 11.5–15.5)
WBC: 6.4 10*3/uL (ref 4.0–10.5)
nRBC: 0 % (ref 0.0–0.2)

## 2023-06-17 LAB — TYPE AND SCREEN
ABO/RH(D): B POS
Antibody Screen: NEGATIVE

## 2023-06-17 MED ORDER — FENTANYL CITRATE (PF) 100 MCG/2ML IJ SOLN
INTRAMUSCULAR | Status: AC
  Filled 2023-06-17: qty 2

## 2023-06-17 MED ORDER — TRANEXAMIC ACID-NACL 1000-0.7 MG/100ML-% IV SOLN: 1000.0000 mg | INTRAVENOUS | Status: AC

## 2023-06-17 MED ORDER — TRANEXAMIC ACID-NACL 1000-0.7 MG/100ML-% IV SOLN
INTRAVENOUS | Status: AC
  Filled 2023-06-17: qty 100

## 2023-06-17 MED ORDER — ACETAMINOPHEN 325 MG PO TABS: 650.0000 mg | ORAL_TABLET | ORAL | Status: DC | PRN

## 2023-06-17 MED ORDER — LACTATED RINGERS IV SOLN: INTRAVENOUS | Status: DC

## 2023-06-17 MED ORDER — ONDANSETRON HCL 4 MG/2ML IJ SOLN: 4.0000 mg | Freq: Four times a day (QID) | INTRAMUSCULAR | Status: DC | PRN

## 2023-06-17 MED ORDER — OXYTOCIN-SODIUM CHLORIDE 30-0.9 UT/500ML-% IV SOLN
2.5000 [IU]/h | INTRAVENOUS | Status: DC
  Filled 2023-06-17: qty 500

## 2023-06-17 MED ORDER — OXYCODONE-ACETAMINOPHEN 5-325 MG PO TABS: 1.0000 | ORAL_TABLET | ORAL | Status: DC | PRN

## 2023-06-17 MED ORDER — LACTATED RINGERS IV SOLN: 500.0000 mL | INTRAVENOUS | Status: DC | PRN

## 2023-06-17 MED ORDER — OXYTOCIN BOLUS FROM INFUSION
333.0000 mL | Freq: Once | INTRAVENOUS | Status: AC
  Administered 2023-06-17: 333 mL via INTRAVENOUS

## 2023-06-17 MED ORDER — FENTANYL CITRATE (PF) 100 MCG/2ML IJ SOLN
100.0000 ug | INTRAMUSCULAR | Status: DC | PRN
  Administered 2023-06-17: 100 ug via INTRAVENOUS

## 2023-06-17 MED ORDER — OXYCODONE-ACETAMINOPHEN 5-325 MG PO TABS: 2.0000 | ORAL_TABLET | ORAL | Status: DC | PRN

## 2023-06-17 MED ORDER — LIDOCAINE HCL (PF) 1 % IJ SOLN: 30.0000 mL | INTRAMUSCULAR | Status: DC | PRN

## 2023-06-17 MED ORDER — SOD CITRATE-CITRIC ACID 500-334 MG/5ML PO SOLN: 30.0000 mL | ORAL | Status: DC | PRN

## 2023-06-17 NOTE — H&P (Signed)
Catherine Lee is a 22 y.o. female presenting for active labor at term.   Nursing Staff Provider  Office Location [place] Dating  07/01/2023, by Last Menstrual Period  [facility] Model [x]  Traditional [ ]  Centering [ ]  Mom-Baby Dyad    Language  English Anatomy US  normal  Flu Vaccine  12/29/22 Genetic/Carrier Screen  NIPS:  LR female  AFP:    Horizon: +alpha thal  TDaP Vaccine   04-12-23 Hgb A1C or  GTT Early  Third trimester incomplete, normal fasting and 1 hour, declined to re-do  COVID Vaccine    LAB RESULTS   Rhogam  B/Positive/-- (03/07 1438)  Blood Type B/Positive/-- (03/07 1438)   Baby Feeding Plan Formula Antibody Negative (03/07 1438)  Contraception Nexplanon IP Rubella 4.30 (03/07 1438)  Circumcision Yes, IP RPR Non Reactive (03/07 1438)   Pediatrician  [facility]  HBsAg Negative (03/07 1438)   Support Person Montrez(FOB)  HCVAb Non Reactive (03/07 1438)   Prenatal Classes  HIV Non Reactive (03/07 1438)     BTL Consent NA GBS   (For PCN allergy, check sensitivities)   VBAC Consent NA Pap Diagnosis  Date Value Ref Range Status  12/29/2022 (A)  Final   - Atypical squamous cells of undetermined significance (ASC-US)         DME Rx [ ]  BP cuff [ ]  Weight Scale Waterbirth  [ ]  Class [ ]  Consent [ ]  CNM visit  PHQ9 & GAD7 [  ] new OB [  ] 28 weeks  [  ] 36 weeks Induction  [ ]  Orders Entered [ ] Foley Y/N    OB History     Gravida  2   Para  1   Term  1   Preterm      AB      Living  1      SAB      IAB      Ectopic      Multiple  0   Live Births  1          Past Medical History:  Diagnosis Date   Asthma    Chlamydia    H/O seasonal allergies    Past Surgical History:  Procedure Laterality Date   HERNIA REPAIR  2003   umbilical hernia   Family History: family history includes Asthma in her maternal grandmother and mother; Cancer in her maternal grandfather; Diabetes in her maternal grandmother; Heart disease in her maternal grandmother; Hypertension  in her mother; Kidney disease in her paternal aunt. Social History:  reports that she has never smoked. She has never used smokeless tobacco. She reports that she does not drink alcohol and does not use drugs.     Maternal Diabetes: No Genetic Screening: Normal Maternal Ultrasounds/Referrals: Normal Fetal Ultrasounds or other Referrals:  None Maternal Substance Abuse:  No Significant Maternal Medications:  None Significant Maternal Lab Results:  None Number of Prenatal Visits:greater than 3 verified prenatal visits Other Comments:  None  Review of Systems  Constitutional:  Negative for chills, fatigue and fever.  Eyes:  Negative for visual disturbance.  Respiratory:  Negative for shortness of breath.   Cardiovascular:  Negative for chest pain.  Gastrointestinal:  Positive for abdominal pain. Negative for vomiting.  Genitourinary:  Negative for difficulty urinating, dysuria, flank pain, pelvic pain, vaginal bleeding, vaginal discharge and vaginal pain.  Musculoskeletal:  Positive for back pain.  Neurological:  Negative for dizziness and headaches.  Psychiatric/Behavioral: Negative.  Maternal Medical History:  Reason for admission: Contractions.   Contractions: Onset was 3-5 hours ago.   Frequency: regular.   Perceived severity is moderate.   Fetal activity: Perceived fetal activity is normal.   Last perceived fetal movement was within the past hour.   Prenatal complications: no prenatal complications Prenatal Complications - Diabetes: none.   Dilation: 5.5 Effacement (%): 70 Station: 0 Exam by:: Henrine Screws, RN Blood pressure 117/67, pulse 100, temperature 98.5 F (36.9 C), resp. rate 17, height 5\' 11"  (1.803 m), weight 77.7 kg, last menstrual period 09/24/2022, SpO2 100%. Maternal Exam:  Uterine Assessment: Contraction strength is moderate.  Contraction frequency is regular.  Abdomen: Fetal presentation: vertex Pelvis: adequate for delivery.   Cervix: Cervix  evaluated by digital exam.     Fetal Exam Fetal Monitor Review: Mode: ultrasound.   Baseline rate: 130.  Variability: moderate (6-25 bpm).   Pattern: accelerations present.   Fetal State Assessment: Category I - tracings are normal.   Physical Exam Vitals and nursing note reviewed.  Constitutional:      Appearance: She is well-developed.  Cardiovascular:     Rate and Rhythm: Normal rate.  Pulmonary:     Effort: Pulmonary effort is normal.  Abdominal:     Palpations: Abdomen is soft.  Musculoskeletal:        General: Normal range of motion.     Cervical back: Normal range of motion.  Skin:    General: Skin is warm and dry.  Neurological:     Mental Status: She is alert and oriented to person, place, and time.  Psychiatric:        Behavior: Behavior normal.        Thought Content: Thought content normal.        Judgment: Judgment normal.     Prenatal labs: ABO, Rh: --/--/B POS (07/10 0439) Antibody: NEG (07/10 0439) Rubella: 4.30 (03/07 1438) RPR: Non Reactive (06/19 0856)  HBsAg: Negative (03/07 1438)  HIV: Non Reactive (06/19 0856)  GBS: Negative/-- (08/12 1039)   Assessment/Plan: G2P1001 at [redacted]w[redacted]d admitted for active labor at term GBS negative  Expectant management Pt plans to bottle feed and desires IP Nexplanon Desires circumcision  Anticipate NSVD  Sharen Counter 06/17/2023, 8:45 PM

## 2023-06-17 NOTE — MAU Note (Signed)
.  Catherine Lee is a 22 y.o. at [redacted]w[redacted]d here in MAU reporting: she was seen last pm and her cervix did not change and she was sent home . Contractions are closer and stronger now. Some spotting, denies ROM. Reports positive fetal movement.  Onset of complaint: today. Pain score: 10/10 Vitals:   06/17/23 2013  BP: 117/67  Pulse: 100  Resp: 17  Temp: 98.5 F (36.9 C)  SpO2: 100%     FHT:128 Lab orders placed from triage:

## 2023-06-17 NOTE — MAU Provider Note (Cosign Needed Addendum)
S: Ms. Catherine Lee is a 22 y.o. G2P1001 at [redacted]w[redacted]d  who presents to MAU today complaining contractions q 3-5 minutes since 1600. She denies vaginal bleeding. She denies LOF. She reports normal fetal movement.    O: BP 126/73   Pulse 92   Temp 98.9 F (37.2 C) (Oral)   Resp 18   Ht 5\' 11"  (1.803 m)   Wt 75.8 kg   LMP 09/24/2022   SpO2 100%   BMI 23.29 kg/m  GENERAL: Well-developed, well-nourished female in no acute distress.  HEAD: Normocephalic, atraumatic.  CHEST: Normal effort of breathing, regular heart rate ABDOMEN: Soft, nontender, gravid  Cervical exam:  Dilation: 4 Effacement (%): 90 Cervical Position: Posterior Station: -2 Presentation: Vertex Exam by:: Joline Salt, RN   Fetal Monitoring: Baseline: 135 Variability: moderate Accelerations: present Decelerations: absent Contractions: q3-4   A/P: SIUP at [redacted]w[redacted]d here in latent labor. Tolerating ctx well, minimal change since presentation to MAU. Given some small change in cx exam and pt being multiparous, offered admission vs discharge home with labor precautions given and pt opted to be discharged to continue to labor at home. Labor precautions discussed in detail with pt. Stable for d/c.  Patient discussed with Dr. Charlotta Newton.   Sundra Aland, MD OB Fellow, Faculty Practice [facility], Center for [facility]

## 2023-06-17 NOTE — Discharge Summary (Signed)
Postpartum Discharge Summary  Date of Service updated***     Patient Name: Catherine Lee DOB: [birth year] MRN: [id number]  Date of admission: 06/17/2023 Delivery date:06/17/2023 Delivering provider: Sharen Counter A Date of discharge: 06/17/2023  Admitting diagnosis: Normal labor [O80, Z37.9] Intrauterine pregnancy: [redacted]w[redacted]d     Secondary diagnosis:  Active Problems:   Normal spontaneous vaginal delivery  Additional problems: *** none   Discharge diagnosis: Term Pregnancy Delivered                                              Post partum procedures: none Augmentation: AROM Complications: None  Hospital course: Onset of Labor With Vaginal Delivery      22 y.o. yo G2P1001 at [redacted]w[redacted]d was admitted in Active Labor on 06/17/2023. Labor course was uncomplicated.  Membrane Rupture Time/Date: 9:54 PM,06/17/2023  Delivery Method:Vaginal, Spontaneous Operative Delivery:N/A Episiotomy:   Lacerations:    Patient had a postpartum course complicated by ***.  She is ambulating, tolerating a regular diet, passing flatus, and urinating well. Patient is discharged home in stable condition on 06/17/23.  Newborn Data: Birth date:06/17/2023 Birth time:10:06 PM Gender:Female Living status:  Apgars: ,  Weight:   Magnesium Sulfate received: No BMZ received: Yes Rhophylac:No MMR:No T-DaP:Given prenatally Flu: Yes Transfusion:No  Physical exam  Vitals:   06/17/23 2013 06/17/23 2118  BP: 117/67 130/79  Pulse: 100 83  Resp: 17 16  Temp: 98.5 F (36.9 C) 97.9 F (36.6 C)  TempSrc:  Oral  SpO2: 100%   Weight: 77.7 kg   Height: 5\' 11"  (1.803 m)    General: {Exam; general:21111117} Lochia: {Desc; appropriate/inappropriate:30686::"appropriate"} Uterine Fundus: {Desc; firm/soft:30687} Incision: {Exam; incision:21111123} DVT Evaluation: {Exam; QMV:7846962} Labs: Lab Results  Component Value Date   WBC 6.4 06/17/2023   HGB 10.4 (L) 06/17/2023   HCT 32.4 (L) 06/17/2023   MCV 87.1  06/17/2023   PLT 223 06/17/2023      Latest Ref Rng & Units 01/23/2018    3:00 PM  CMP  Glucose 65 - 99 mg/dL 952   BUN 6 - 20 mg/dL 10   Creatinine 8.41 - 1.00 mg/dL 3.24   Sodium 401 - 027 mmol/L 134   Potassium 3.5 - 5.1 mmol/L 2.9   Chloride 101 - 111 mmol/L 100   CO2 22 - 32 mmol/L 21   Calcium 8.9 - 10.3 mg/dL 8.9   Total Protein 6.5 - 8.1 g/dL 8.5   Total Bilirubin 0.3 - 1.2 mg/dL 1.3   Alkaline Phos 38 - 126 U/L 45   AST 15 - 41 U/L 23   ALT 14 - 54 U/L 13    Edinburgh Score:     No data to display           After visit meds:  Allergies as of 06/17/2023   No Known Allergies   Med Rec must be completed prior to using this [facility]***        Discharge home in stable condition Infant Feeding: {Baby feeding:23562} Infant Disposition:{CHL IP OB HOME WITH OZDGUY:40347} Discharge instruction: per After Visit Summary and Postpartum booklet. Activity: Advance as tolerated. Pelvic rest for 6 weeks.  Diet: {OB QQVZ:56387564} Future Appointments: Future Appointments  Date Time Provider Department Center  06/22/2023  9:55 AM Levie Heritage, DO [facility] None  06/29/2023 11:15 AM Adrian Blackwater Rhona Raider, DO [facility] None   Follow up  Visit:  Message sent to [facility] on 06/17/23:  Please schedule this patient for a In person postpartum visit in 6 weeks with the following provider: Any provider. Additional Postpartum F/U: none   Low risk pregnancy complicated by:  n/a Delivery mode:  Vaginal, Spontaneous Anticipated Birth Control:  Nexplanon inpatient   06/17/2023 Sharen Counter, CNM

## 2023-06-18 ENCOUNTER — Encounter (HOSPITAL_COMMUNITY): Payer: Self-pay | Admitting: Obstetrics & Gynecology

## 2023-06-18 LAB — RPR: RPR Ser Ql: NONREACTIVE

## 2023-06-18 MED ORDER — DIBUCAINE (PERIANAL) 1 % EX OINT: 1.0000 | TOPICAL_OINTMENT | CUTANEOUS | Status: DC | PRN

## 2023-06-18 MED ORDER — WITCH HAZEL-GLYCERIN EX PADS: 1.0000 | MEDICATED_PAD | CUTANEOUS | Status: DC | PRN

## 2023-06-18 MED ORDER — DIPHENHYDRAMINE HCL 25 MG PO CAPS: 25.0000 mg | ORAL_CAPSULE | Freq: Four times a day (QID) | ORAL | Status: DC | PRN

## 2023-06-18 MED ORDER — SENNOSIDES-DOCUSATE SODIUM 8.6-50 MG PO TABS
2.0000 | ORAL_TABLET | Freq: Every day | ORAL | Status: DC
  Administered 2023-06-18 – 2023-06-19 (×2): 2 via ORAL
  Filled 2023-06-18 (×2): qty 2

## 2023-06-18 MED ORDER — SIMETHICONE 80 MG PO CHEW: 80.0000 mg | CHEWABLE_TABLET | ORAL | Status: DC | PRN

## 2023-06-18 MED ORDER — ZOLPIDEM TARTRATE 5 MG PO TABS: 5.0000 mg | ORAL_TABLET | Freq: Every evening | ORAL | Status: DC | PRN

## 2023-06-18 MED ORDER — IBUPROFEN 600 MG PO TABS
600.0000 mg | ORAL_TABLET | Freq: Four times a day (QID) | ORAL | Status: DC
  Administered 2023-06-18 – 2023-06-19 (×6): 600 mg via ORAL
  Filled 2023-06-18 (×6): qty 1

## 2023-06-18 MED ORDER — ONDANSETRON HCL 4 MG/2ML IJ SOLN: 4.0000 mg | INTRAMUSCULAR | Status: DC | PRN

## 2023-06-18 MED ORDER — TETANUS-DIPHTH-ACELL PERTUSSIS 5-2.5-18.5 LF-MCG/0.5 IM SUSY: 0.5000 mL | PREFILLED_SYRINGE | Freq: Once | INTRAMUSCULAR | Status: DC

## 2023-06-18 MED ORDER — ONDANSETRON HCL 4 MG PO TABS: 4.0000 mg | ORAL_TABLET | ORAL | Status: DC | PRN

## 2023-06-18 MED ORDER — BENZOCAINE-MENTHOL 20-0.5 % EX AERO
1.0000 | INHALATION_SPRAY | CUTANEOUS | Status: DC | PRN
  Administered 2023-06-18: 1 via TOPICAL
  Filled 2023-06-18: qty 56

## 2023-06-18 MED ORDER — PRENATAL MULTIVITAMIN CH
1.0000 | ORAL_TABLET | Freq: Every day | ORAL | Status: DC
  Administered 2023-06-18 – 2023-06-19 (×2): 1 via ORAL
  Filled 2023-06-18 (×2): qty 1

## 2023-06-18 MED ORDER — ACETAMINOPHEN 325 MG PO TABS: 650.0000 mg | ORAL_TABLET | ORAL | Status: DC | PRN

## 2023-06-18 MED ORDER — COCONUT OIL OIL: 1.0000 | TOPICAL_OIL | Status: DC | PRN

## 2023-06-18 NOTE — Progress Notes (Signed)
POSTPARTUM PROGRESS NOTE  Subjective: Ceriah Lee is a 22 y.o. [id number] s/p SVD at [redacted]w[redacted]d.  She reports she doing well. No acute events overnight. She denies any problems with ambulating, voiding or po intake. Denies nausea or vomiting.  Pain is well controlled.  Lochia is appropriate.  Objective: Blood pressure 117/65, pulse 66, temperature 98.9 F (37.2 C), temperature source Oral, resp. rate 18, height 5\' 11"  (1.803 m), weight 77.7 kg, last menstrual period 09/24/2022, SpO2 100%, unknown if currently breastfeeding.  Physical Exam:  General: alert, cooperative and no distress Chest: no respiratory distress Abdomen: soft, non-tender  Uterine Fundus: firm and at level of umbilicus Extremities: No calf swelling or tenderness  no edema  Recent Labs    06/17/23 2044  HGB 10.4*  HCT 32.4*    Assessment/Plan: Catherine Lee is a 22 y.o. [id number] s/p SVD at [redacted]w[redacted]d  Routine Postpartum Care: Doing well, pain well-controlled.  -- Continue routine care, lactation support  -- Contraception: nexplanon  -- Feeding: bottle  Dispo: Plan for discharge tomorrow.  Penne Lash, MD Faculty Practice, Center for [facility] 06/18/2023 12:43 PM

## 2023-06-19 DIAGNOSIS — Z30017 Encounter for initial prescription of implantable subdermal contraceptive: Secondary | ICD-10-CM

## 2023-06-19 DIAGNOSIS — Z975 Presence of (intrauterine) contraceptive device: Secondary | ICD-10-CM

## 2023-06-19 MED ORDER — ACETAMINOPHEN 325 MG PO TABS: 650.0000 mg | ORAL_TABLET | ORAL | 0 refills | Status: AC | PRN

## 2023-06-19 MED ORDER — ETONOGESTREL 68 MG ~~LOC~~ IMPL
68.0000 mg | DRUG_IMPLANT | Freq: Once | SUBCUTANEOUS | Status: AC
  Administered 2023-06-19: 68 mg via SUBCUTANEOUS
  Filled 2023-06-19: qty 1

## 2023-06-19 MED ORDER — LIDOCAINE HCL 1 % IJ SOLN
0.0000 mL | Freq: Once | INTRAMUSCULAR | Status: AC | PRN
  Administered 2023-06-19: 3 mL via INTRADERMAL
  Filled 2023-06-19: qty 20

## 2023-06-19 MED ORDER — POLYETHYLENE GLYCOL 3350 17 GM/SCOOP PO POWD: 17.0000 g | Freq: Every day | ORAL | 1 refills | Status: AC | PRN

## 2023-06-19 MED ORDER — IBUPROFEN 600 MG PO TABS: 600.0000 mg | ORAL_TABLET | Freq: Four times a day (QID) | ORAL | 0 refills | Status: AC

## 2023-06-19 NOTE — Procedures (Signed)
Post-Placental Nexplanon Insertion Procedure Note  Patient was identified. Informed consent was signed, signed copy in chart. A time-out was performed.    The insertion site was identified 8-10 cm (3-4 inches) from the medial epicondyle of the humerus and 3-5 cm (1.25-2 inches) posterior to (below) the sulcus (groove) between the biceps and triceps muscles of the patient's left arm and marked. The site was prepped and draped in the usual sterile fashion. Pt was prepped with alcohol swab and then injected with 1 cc of 1% lidocaine. The site was prepped with betadine. Nexplanon removed form packaging,  Device confirmed in needle, then inserted full length of needle and withdrawn per handbook instructions. Provider and patient verified presence of the implant in the woman's arm by palpation. Pt insertion site was covered with steristrips/adhesive bandage and pressure bandage. There was minimal blood loss. Patient tolerated procedure well.  Patient was given post procedure instructions and Nexplanon user card with expiration date. Patient was asked to keep the pressure dressing on for 24 hours to minimize bruising and keep the adhesive bandage on for 3-5 days. The patient verbalized understanding of the plan of care and agrees.   Lot # [id number]  Expiration Date10/26/2024

## 2023-06-22 ENCOUNTER — Encounter: Payer: Medicaid Other | Admitting: Family Medicine

## 2023-06-29 ENCOUNTER — Encounter: Payer: Medicaid Other | Admitting: Family Medicine

## 2023-07-01 ENCOUNTER — Inpatient Hospital Stay (HOSPITAL_COMMUNITY): Admission: AD | Admit: 2023-07-01 | Payer: Medicaid Other | Source: Home / Self Care | Admitting: Family Medicine

## 2023-07-19 ENCOUNTER — Telehealth (HOSPITAL_COMMUNITY): Payer: Self-pay

## 2023-07-19 NOTE — Telephone Encounter (Signed)
07/19/2023 1142  Name: Catherine Lee MRN: [id number] DOB: 2001-08-26  Reason for Call:  Transition of Care Hospital Discharge Call  Contact Status: Patient Contact Status: Unable to contact Phone line rings and says call cannot be completed as dialed.   Language assistant needed: Interpreter Mode: Interpreter Not Needed        Follow-Up Questions:    Inocente Salles Postnatal Depression Scale:  In the Past 7 Days:    PHQ2-9 Depression Scale:     Discharge Follow-up:    Post-discharge interventions: NA  Signature  Signe Colt

## 2023-08-03 ENCOUNTER — Encounter: Payer: Self-pay | Admitting: Obstetrics & Gynecology

## 2023-08-03 ENCOUNTER — Ambulatory Visit: Payer: Medicaid Other | Admitting: Obstetrics & Gynecology

## 2023-08-03 DIAGNOSIS — R87610 Atypical squamous cells of undetermined significance on cytologic smear of cervix (ASC-US): Secondary | ICD-10-CM | POA: Diagnosis not present

## 2023-08-03 NOTE — Progress Notes (Signed)
    Post Partum Visit Note  Catherine Lee is a 22 y.o. G68P2002 female who presents for a postpartum visit. She is 6 weeks postpartum following a normal spontaneous vaginal delivery.  I have fully reviewed the prenatal and intrapartum course. The delivery was at 38 gestational weeks.  Anesthesia: none. Postpartum course has been good. Baby is doing well. Baby is feeding by  Similac Advance . Bleeding no bleeding. Bowel function is normal. Bladder function is normal. Patient is not sexually active. Contraception method is Nexplanon. Postpartum depression screening: negative.   The pregnancy intention screening data noted above was reviewed. Potential methods of contraception were discussed. The patient elected to proceed with No data recorded.    Health Maintenance Due  Topic Date Due   HPV VACCINES (1 - 3-dose series) Never done   INFLUENZA VACCINE  05/25/2023   COVID-19 Vaccine (1 - 2023-24 season) Never done    The following portions of the patient's history were reviewed and updated as appropriate: allergies, current medications, past family history, past medical history, past social history, past surgical history, and problem list.  Review of Systems Pertinent items are noted in HPI.  Objective:  LMP 09/24/2022    General:  alert, cooperative, and no distress   Breasts:  not indicated  Lungs:   Heart:  regular rate and rhythm  Abdomen: soft, non-tender; bowel sounds normal; no masses,  no organomegaly   Wound   GU exam:  not indicated       Assessment:    1. Postpartum care and examination    normal postpartum exam.   Plan:   Essential components of care per ACOG recommendations:  1.  Mood and well being: Patient with negative depression screening today. Reviewed local resources for support.  - Patient tobacco use? No.   - hx of drug use? No.    2. Infant care and feeding:  -Patient currently breastmilk feeding? No.  -Social determinants of health (SDOH) reviewed  in EPIC. No concerns  3. Sexuality, contraception and birth spacing - Patient does not want a pregnancy in the next year.  Desired family size is 2 children.  - Reviewed reproductive life planning. Reviewed contraceptive methods based on pt preferences and effectiveness.  Patient desired Hormonal Implant today.   - Discussed birth spacing of 18 months  4. Sleep and fatigue -Encouraged family/partner/community support of 4 hrs of uninterrupted sleep to help with mood and fatigue  5. Physical Recovery  - Discussed patients delivery and complications. She describes her labor as good. - Patient had a Vaginal, no problems at delivery. Patient had a 1st degree laceration. Perineal healing reviewed. Patient expressed understanding - Patient has urinary incontinence? No. - Patient is safe to resume physical and sexual activity  6.  Health Maintenance - HM due items addressed Yes - Last pap smear  Diagnosis  Date Value Ref Range Status  12/29/2022 (A)  Final   - Atypical squamous cells of undetermined significance (ASC-US)   Pap smear not done at today's visit.  -Breast Cancer screening indicated? No.   7. Chronic Disease/Pregnancy Condition follow up: None  - PCP follow up  Adam Phenix, MD  Center for [facility], [facility]
